# Patient Record
Sex: Female | Born: 1990
Health system: Southern US, Community
[De-identification: ages and names within clinical notes are randomized; demographics above are authoritative.]

## PROBLEM LIST (undated history)

## (undated) DIAGNOSIS — Z973 Presence of spectacles and contact lenses: Secondary | ICD-10-CM

## (undated) DIAGNOSIS — Z1509 Genetic susceptibility to other malignant neoplasm: Secondary | ICD-10-CM

## (undated) DIAGNOSIS — F419 Anxiety disorder, unspecified: Secondary | ICD-10-CM

## (undated) DIAGNOSIS — Z789 Other specified health status: Secondary | ICD-10-CM

## (undated) HISTORY — PX: ABDOMINAL HYSTERECTOMY: SHX81

## (undated) HISTORY — PX: ESOPHAGOGASTRODUODENOSCOPY: SHX1529

## (undated) HISTORY — PX: NO PAST SURGERIES: SHX2092

---
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
## 2009-01-05 IMAGING — US US ABDOMEN COMPLETE
1 series · 14 of 25 positions shown · non-contrast
Comparison: none

CLINICAL DATA: Abdominal pain and vomiting. 
ABDOMEN ULTRASOUND:
TECHNIQUE: Complete abdominal ultrasound examination was performed including evaluation of the liver, gallbladder, bile ducts, pancreas, kidneys, spleen, IVC, and abdominal aorta.

[Series 1: unknown · 0.32mm/px · 14 of 71 slices shown]
[im 1/71]
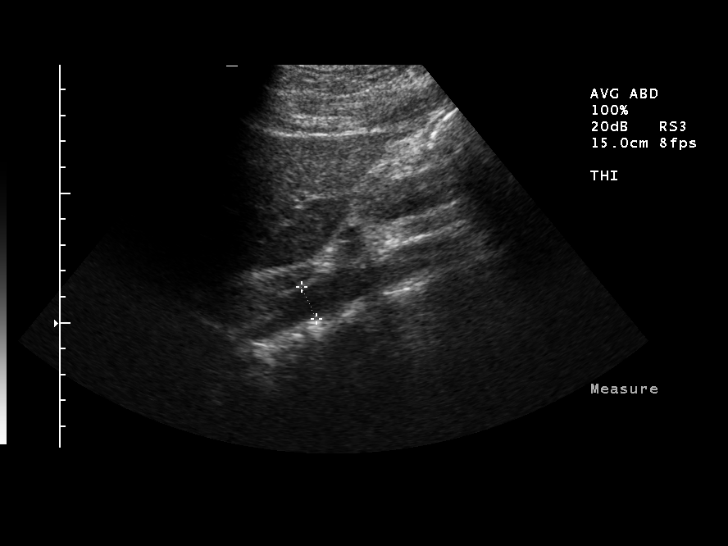
[im 6/71]
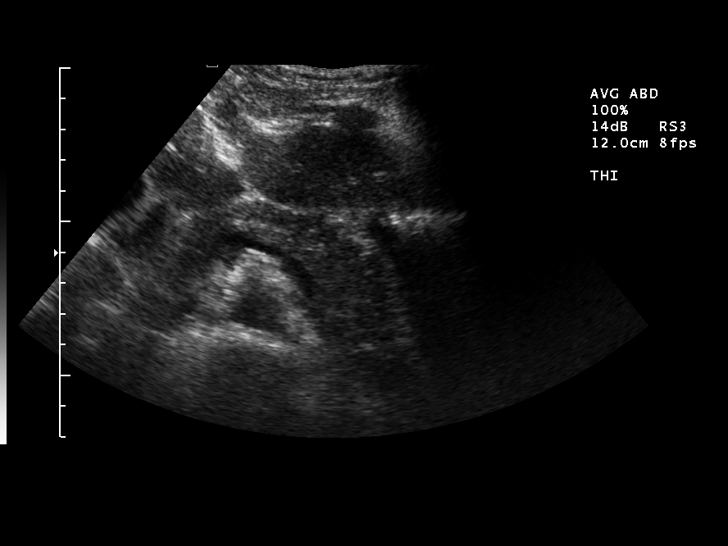
[im 12/71]
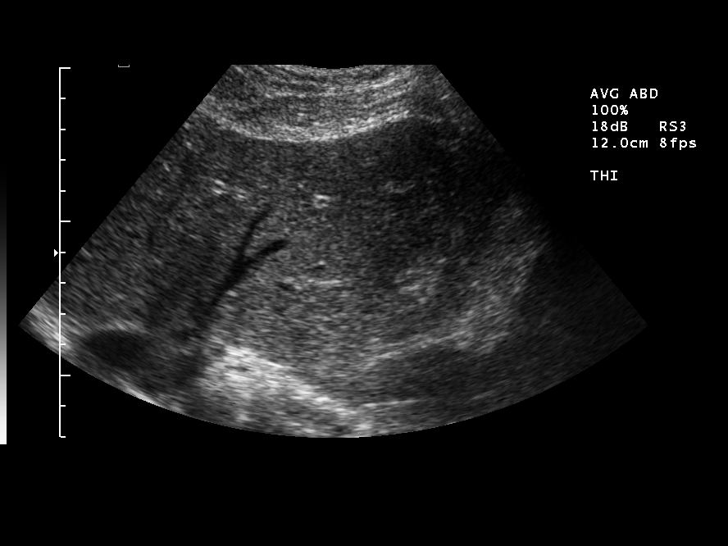
[im 18/71]
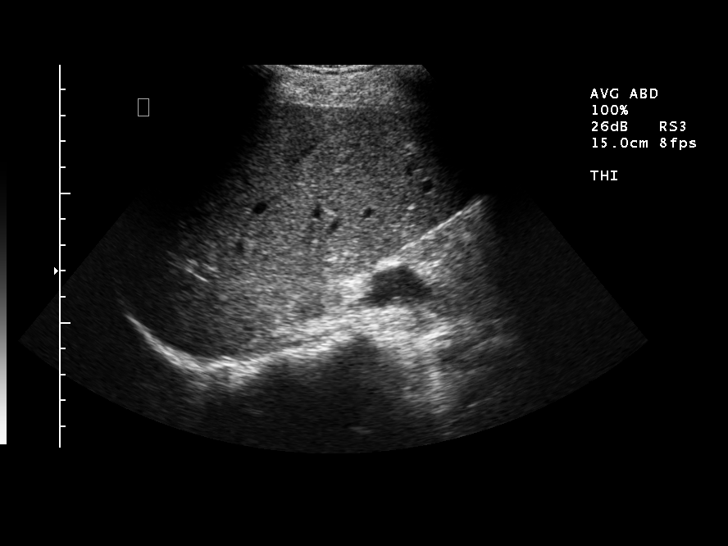
[im 24/71]
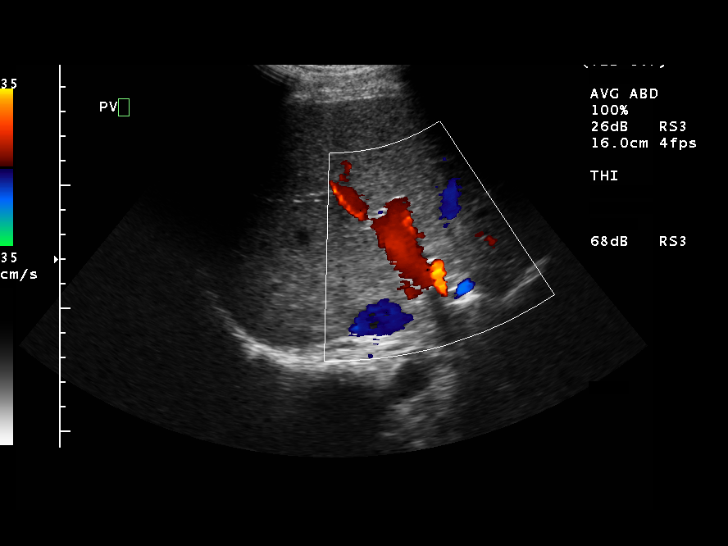
[im 27/71]
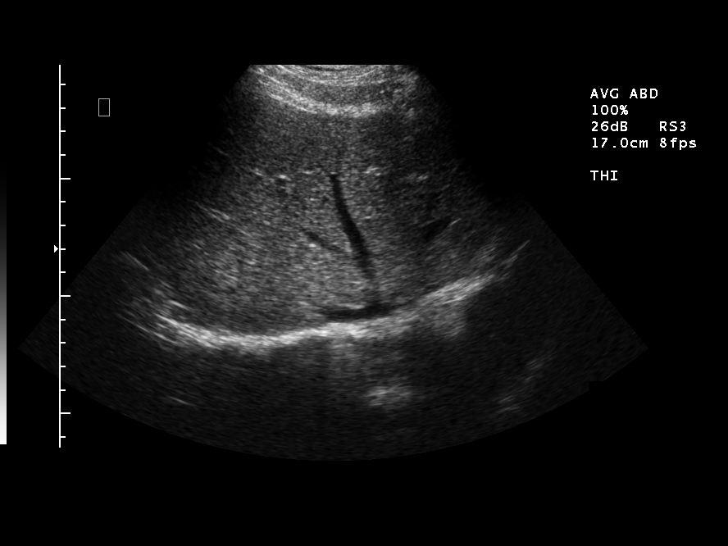
[im 33/71]
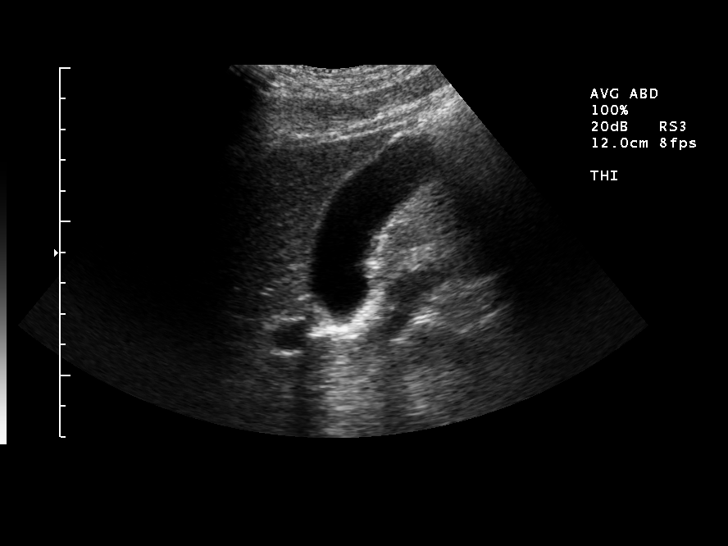
[im 38/71]
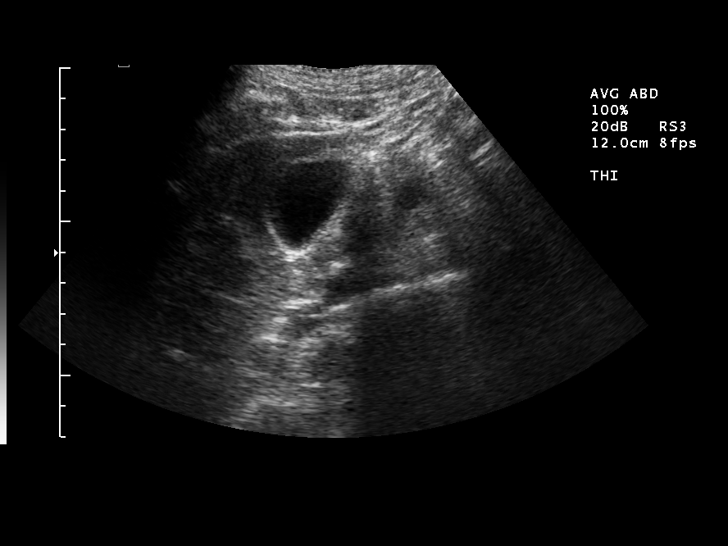
[im 44/71]
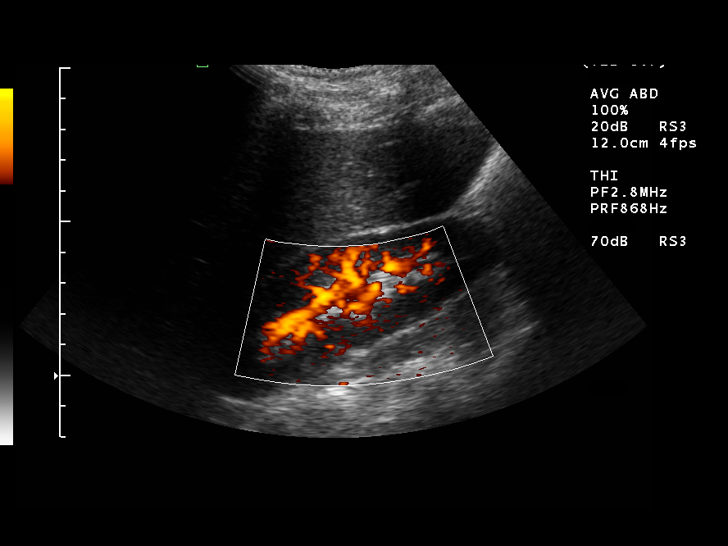
[im 47/71]
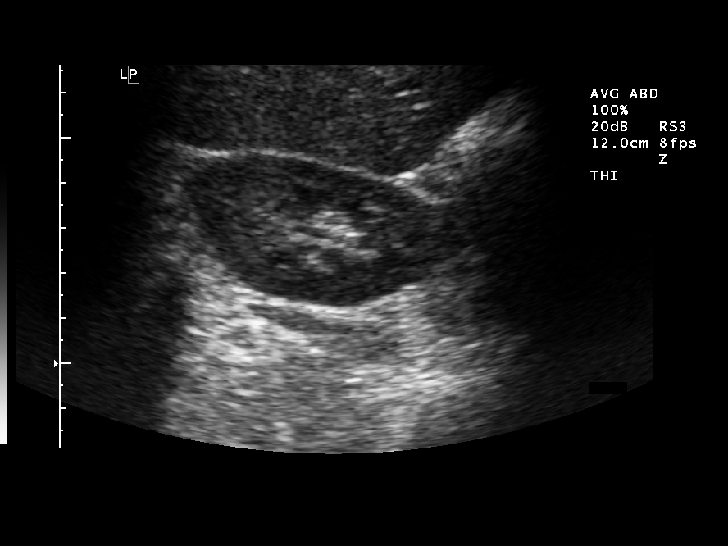
[im 53/71]
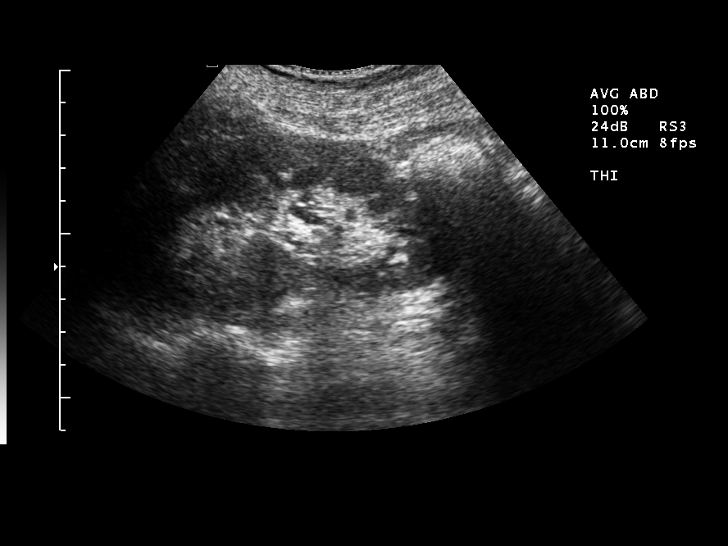
[im 59/71]
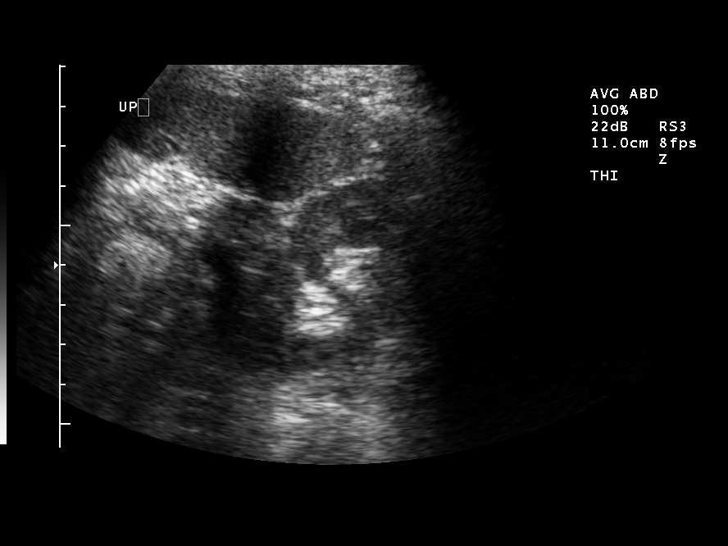
[im 65/71]
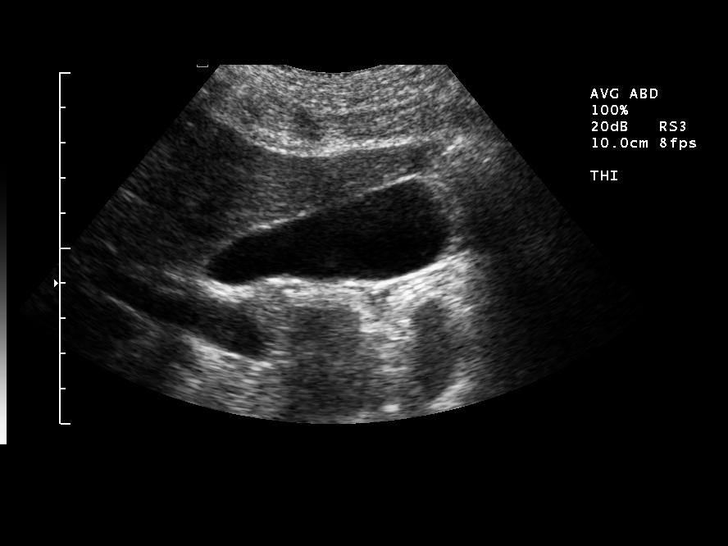
[im 71/71]
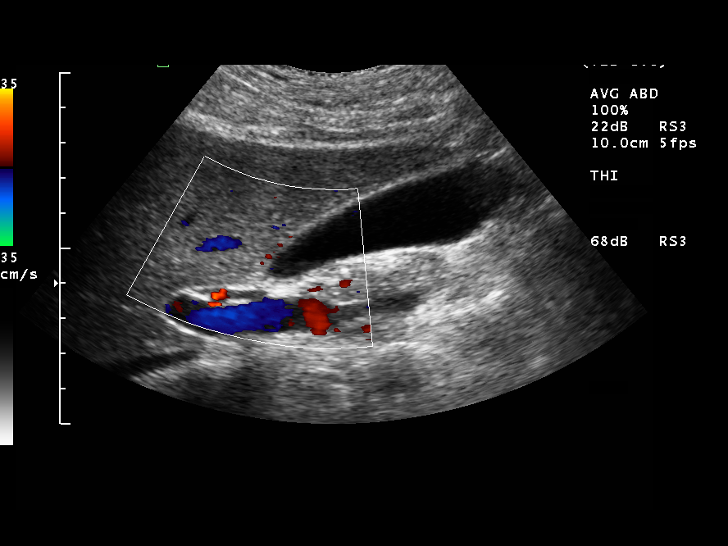

[14 of 25 positions shown; findings below may reference images not displayed]

FINDINGS: Normal gallbladder.  Gallbladder wall thickness is 2 mm.  Common duct measures 3.1 mm in diameter which is well within normal limits.  No hepatic, splenic, pancreas, or renal abnormality.  Spleen measures 9.7 cm in length.  Right and left kidneys measure 11.6 cm and 11.0 cm in length, respectively.  The IVC is patent.  The abdominal aorta maximum diameter is 1.4 cm.  No abdominal mass or abnormal fluid collection.
IMPRESSION: Normal abdominal ultrasound.   Abdominal aorta maximum diameter is 0.8 cm.  Patent IVC.  No abdominal mass or abnormal fluid collection.

## 2009-08-24 ENCOUNTER — Ambulatory Visit: Payer: Self-pay | Admitting: Family Medicine

## 2009-11-19 ENCOUNTER — Emergency Department: Payer: Self-pay | Admitting: Internal Medicine

## 2010-10-16 ENCOUNTER — Telehealth: Payer: Self-pay | Admitting: Internal Medicine

## 2010-12-29 ENCOUNTER — Encounter: Payer: Self-pay | Admitting: *Deleted

## 2011-01-08 NOTE — Procedures (Signed)
Summary: Colonoscopy   Patient Name: Stephanie Brandt, Harl MRN:  Procedure Procedures: Colonoscopy CPT: 34742.  Personnel: Endoscopist: Dora L. [name], MD.  Referred By: Carlean Purl, MD.  Exam Location: Exam performed in Outpatient Clinic. Outpatient  Patient Consent: Procedure, Alternatives, Risks and Benefits discussed, consent obtained, from patient. Consent was obtained by the RN.  Indications Symptoms: Hematochezia.  Increased Risk Screening: For family history of colorectal neoplasia, in  parent age at onset: 18. grandparent age at onset: 15. paternal uncle age 34  History  Current Medications: Patient is not currently taking Coumadin.  Pre-Exam Physical: Performed Jun 05, 2006. Entire physical exam was normal.  Comments: Pt. history reviewed/updated, physical exam performed prior to initiation of sedation?yes Exam Exam: Extent of exam reached: Cecum, extent intended: Cecum.  The cecum was identified by appendiceal orifice and IC valve. Colon retroflexion performed. Images taken. ASA Classification: I. Tolerance: good.  Monitoring: Pulse and BP monitoring, Oximetry used. Supplemental O2 given.  Colon Prep Used Miralax for colon prep. Prep results: good.  Sedation Meds: Patient assessed and found to be appropriate for moderate (conscious) sedation. Fentanyl 100 mcg. given IV. Versed 10 mg. given IV.  Findings - NORMAL EXAM: Cecum.  NORMAL EXAM: Sigmoid Colon.  - NORMAL EXAM: Rectum. Comments: minimal anal fissuring, no hemorrhoids.    Comments: scope withdrawal time 10:36 min Assessment Normal examination.  Comments: no polyps, rectal bleeding likely from an anal source Events  Unplanned Interventions: No intervention was required.  Unplanned Events: There were no complications. Plans Medication Plan: AnusolHC supp: 1 HS, starting Jun 05, 2006   Patient Education: Patient given standard instructions for: Yearly hemoccult testing recommended.  Patient instructed to get routine colonoscopy every 10 years.  Comments: follow up with  me for rectal bleeding in 3 months Disposition: After procedure patient sent to recovery. After recovery patient sent home.    cc:  Carlean Purl, MD  This report was created from the original endoscopy report, which was reviewed and signed by the above listed endoscopist.

## 2011-01-08 NOTE — Progress Notes (Signed)
Summary: Colonoscopy   Phone Note Call from Patient Call back at Home Phone [phone]   Caller: Patient Call For: Dr. [name] Reason for Call: Talk to Nurse Details for Reason: Colonoscopy Summary of Call: Pt. called requesting to be put on sooner "rotation" for colonoscopies (possibly every 4-5 years or sooner). Currently, she is on 10-year, but due to fam hx (father died of colon ca at age 21) and the fact that most of her family tends to develop tumors in their late 20's, she prefers not to wait until she is 53 to have her next procedure. Currently, pt. is having no symptoms. Last colon in 2007 when pt. was 15. Please call to discuss. Initial call taken by: Schuyler Amor,  October 16, 2010 11:29 AM  Follow-up for Phone Call        Message left for patient to call back. Jesse Fall RN  October 16, 2010 11:54 AM Spoke with patient. She is requesting to have a colonoscopy earlier than the 10 year recall recommended after her colonoscopy  from 2023/05/22. Her father died of colon cancer at age 58 and she is concerned about waiting 10 years. She has no symptoms at present. She is aware that it may be next week before I have an answer on this. Please, advise. Follow-up by: Jesse Fall RN,  October 18, 2010 10:26 AM  Additional Follow-up for Phone Call Additional follow up Details #1::        Yes, I agree. Every 5 years  is a appropriate interval. She will be due in June 2012. Please send her a recall letter in May 2012. Additional Follow-up by: Hart Carwin MD,  October 20, 2010 2:39 PM     Appended Document: Colonoscopy    Clinical Lists Changes  Observations: Added new observation of COLONNXTDUE: 06/2011 (10/22/2010 8:20)      Appended Document: Colonoscopy Message left for patient to call back. Jesse Fall, RN 10/22/10 at 8:30 AM  Appended Document: Colonoscopy Spoke with patient. She states her father's brother died of colon cancer recently and they have found out  that he had Lynch syndrome. She is going to [facility] for a consultation and to be tested for the syndrome. She may transfer care there also, she will let us know.  Appended Document: Colonoscopy reviewed and agree.

## 2011-01-08 NOTE — Procedures (Signed)
Summary: EGD   Patient Name: Stephanie Brandt, Bloomquist MRN:  Procedure Procedures: Panendoscopy (EGD) CPT: 43235.    with biopsy(s)/brushing(s). CPT: D1846139.  Personnel: Endoscopist: Dora L. [name], MD.  Exam Location: Exam performed in Outpatient Clinic. Outpatient  Patient Consent: Procedure, Alternatives, Risks and Benefits discussed, consent obtained, from patient. Consent was obtained by the RN.  Indications Symptoms: Vomiting.  History  Current Medications: Patient is not currently taking Coumadin.  Pre-Exam Physical: Performed Feb 24, 2008  Entire physical exam was normal.  Comments: Pt. history reviewed/updated, physical exam performed prior to initiation of sedation?yes Exam Exam Info: Maximum depth of insertion Duodenum, intended Duodenum. Vocal cords visualized. Gastric retroflexion performed. Images taken. ASA Classification: I. Tolerance: good.  Sedation Meds: Patient assessed and found to be appropriate for moderate (conscious) sedation. Fentanyl 50 mcg. given IV. Versed 5 mg. given IV. Cetacaine Spray 2 sprays given aerosolized.  Monitoring: BP and pulse monitoring done. Oximetry used. Supplemental O2 given  Findings - Normal: Distal Esophagus.  - Normal: Fundus to Body. Comments: no retained content.  - DIAGNOSTIC TEST: from Antrum. RUT done, results pending  - DIAGNOSTIC TEST: Biopsy taken. from Duodenal Apex to Duodenal 2nd Portion. Reason: r/o sprue.   Assessment Normal examination.  Comments: normal exam, s/p biopsies for H.Pylori and small bowl biopsies to r/o villous atrophy Events  Unplanned Intervention: No unplanned interventions were required.  Unplanned Events: There were no complications. Plans Comments: I think har symptoms are functional. Would  try low dose Paxil 5 mg Disposition: After procedure patient sent to recovery. After recovery patient sent home.    cc:  Carlean Purl, MD  This report was created from the original  endoscopy report, which was reviewed and signed by the above listed endoscopist.

## 2011-04-23 NOTE — Assessment & Plan Note (Signed)
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 All episodes occurred after a  large meal at supper. This week, she has been out of school because of  nausea, vomiting, and diarrhea and per Dr. Genelle Bal, was put on Zofran. Dr.  Genelle Bal suspected viral gastroenteritis. She feels much better now and  will be returning to school on Monday. We saw her Stephanie Brandt in 2006 for  evaluation of rectal bleeding. She has a strong family history of colon  cancer in her parents, who had colon cancer at the age of 65 and  grandparents at 64. Also paternal uncle had colorectal cancer at age 43.  Her colonoscopy on July 05, 2006 was normal. She was treated with rectal  bleeding with Anusol HC suppositories. There does not seem to be  currently any problem. Stephanie Brandt says that she is intolerant to a lot of  food. She is very restricted to what she is eating. Usually, no  breakfast. She may drink a cup of coffee in the morning. She packs a  peanut butter and jelly sandwich for lunch. For supper, she eats by  herself because she does not want to eat with the rest of the family.  She likes salads but a full meal such as chicken with rice or meats with  vegetables do not agree with her. In spite all of this, her weight has  been stable around 130 pounds. She is under a lot of stress in school,  being a high school student. She plays Lacrosse but otherwise, is active  and has not had to stop any of her activities.   MEDICATIONS:  Birth control pills and Levsin sublingual 0.125 mg p.r.n.   PHYSICAL EXAMINATION:  VITAL SIGNS:  Blood pressure  104/56, pulse 72,  weight 131 pounds.  GENERAL:  Alert and oriented. No distress.  HEENT:  Sclerae nonicteric. Oral cavity was normal.  NECK:  Supple. No adenopathy.  LUNGS:  Clear to auscultation.  CARDIOVASCULAR:  Normal S1 and S2.  ABDOMEN:  Soft, nontender. Normal active bowel sounds. No distention. I  could not elicit any tenderness in the epigastrium or right upper  quadrant. There was no CVA tenderness.  RECTAL:  Exam not done.  EXTREMITIES:  No edema.   IMPRESSION:  A 20 year old high school student with periodic vomiting  without failure to thrive, no weight loss. No objective findings on  physical examination. Rule out  gastroesophageal reflux disease. Rule  out gastroparesis or functional dyspepsia. Rule out biliary disease.  There is a family history of gallbladder problems in her mother with  cholecystectomy.   PLAN:  1. Upper endoscopy. Schedule for diagnostic purposes to rule out any      structural lesions.  2. Begin  Prilosec 20 mg daily. Samples to be given.  3. Upper abdominal ultrasound scheduled.  4. I told her to eat small meals. Avoid large meals, especially eating      out late at night..  5. I would like to followup with her in the office after the upper      endoscopy and upper abdominal ultrasound and will talk about      treatment of her dyspepsia.     Stephanie Brandt. [name], MD  Electronically Signed    DMB/MedQ  DD: 02/19/2008  DT: 02/20/2008  Job #: [id number]

## 2011-04-26 NOTE — Procedures (Signed)
AGE:  20   TECHNICAL DESCRIPTION:  This was a routine EEG.   TECHNICAL DATA:  This EEG was recorded during the awake state.  The  background activity is 14 Hz rhythms with higher amplitudes seen in the  posterior head regions bilaterally.  Hyperventilation testing was performed  which produced no abnormalities, and photic stimulation was performed which  did not produce any evidence of an occipital driving response.  No focal  asymmetries and no definite epileptiform activity was seen in this study.  There was no evidence of any stage 2 sleep present.   IMPRESSION:  This is a normal EEG during the awake state.      EAV:WUJW  D:  10/31/2004 11:29:39  T:  10/31/2004 11:53:40  Job #:  [id number]

## 2011-04-30 ENCOUNTER — Encounter: Payer: Self-pay | Admitting: Internal Medicine

## 2011-06-03 ENCOUNTER — Other Ambulatory Visit: Payer: Self-pay | Admitting: Gynecology

## 2012-03-13 ENCOUNTER — Other Ambulatory Visit: Payer: Self-pay | Admitting: Gynecology

## 2012-07-08 LAB — OB RESULTS CONSOLE ABO/RH

## 2012-07-08 LAB — OB RESULTS CONSOLE GC/CHLAMYDIA
Chlamydia: NEGATIVE
Gonorrhea: NEGATIVE

## 2012-07-08 LAB — OB RESULTS CONSOLE ANTIBODY SCREEN: Antibody Screen: NEGATIVE

## 2012-08-09 HISTORY — PX: WISDOM TOOTH EXTRACTION: SHX21

## 2012-12-09 NOTE — L&D Delivery Note (Signed)
Delivery Note Pt progressed to complete and pushed well.  At 7:47 PM a viable female was delivered via Vaginal, Spontaneous Delivery (Presentation: ; Occiput Anterior).  APGAR: 8, 9; weight pending.   Placenta status: Intact, Spontaneous.  Cord:  with the following complications: None.  Anesthesia: Epidural  Episiotomy: None Lacerations: 2nd degree Suture Repair: 2.0 vicryl and 3-0 Vicryl rapide Est. Blood Loss (mL): 400  Mom to postpartum.  Baby to stay with mom.  Discussed circumcision procedure and risks, I signed consent.    Machaela Caterino D 01/11/2013, 8:22 PM

## 2013-01-06 ENCOUNTER — Inpatient Hospital Stay (HOSPITAL_COMMUNITY)
Admission: AD | Admit: 2013-01-06 | Discharge: 2013-01-06 | Disposition: A | Discharge status: Home / Self Care | Payer: No Typology Code available for payment source | Source: Ambulatory Visit | Attending: Obstetrics and Gynecology | Admitting: Obstetrics and Gynecology

## 2013-01-06 ENCOUNTER — Encounter (HOSPITAL_COMMUNITY): Payer: Self-pay | Admitting: *Deleted

## 2013-01-06 DIAGNOSIS — O479 False labor, unspecified: Secondary | ICD-10-CM | POA: Insufficient documentation

## 2013-01-06 HISTORY — DX: Other specified health status: Z78.9

## 2013-01-06 NOTE — MAU Note (Signed)
Pt states she started having pain about 1230 contractions went away at 106pm. Pt states contractions started up at 1730 and contractions were about 10 min apart. Pt states she took a bath to see if contractions would go away , they didn't so she came in after speaking with Dr Ellyn Hack

## 2013-01-06 NOTE — MAU Note (Signed)
Pt G1 at 38.2wks having contractions since 2030.  Denies leaking fluid.

## 2013-01-06 NOTE — Progress Notes (Signed)
Pt states she was having menstrual like cramping and she took tylenol which took her pain away

## 2013-01-11 ENCOUNTER — Encounter (HOSPITAL_COMMUNITY): Payer: Self-pay | Admitting: Anesthesiology

## 2013-01-11 ENCOUNTER — Encounter (HOSPITAL_COMMUNITY): Payer: Self-pay | Admitting: *Deleted

## 2013-01-11 ENCOUNTER — Inpatient Hospital Stay (HOSPITAL_COMMUNITY): Payer: No Typology Code available for payment source | Admitting: Anesthesiology

## 2013-01-11 ENCOUNTER — Inpatient Hospital Stay (HOSPITAL_COMMUNITY)
Admission: AD | Admit: 2013-01-11 | Discharge: 2013-01-13 | DRG: 775 | Disposition: A | Discharge status: Home / Self Care | Payer: No Typology Code available for payment source | Source: Ambulatory Visit | Attending: Obstetrics and Gynecology | Admitting: Obstetrics and Gynecology

## 2013-01-11 DIAGNOSIS — Z1509 Genetic susceptibility to other malignant neoplasm: Secondary | ICD-10-CM

## 2013-01-11 DIAGNOSIS — O4290 Premature rupture of membranes, unspecified as to length of time between rupture and onset of labor, unspecified weeks of gestation: Secondary | ICD-10-CM | POA: Diagnosis present

## 2013-01-11 DIAGNOSIS — IMO0001 Reserved for inherently not codable concepts without codable children: Secondary | ICD-10-CM

## 2013-01-11 DIAGNOSIS — Z800 Family history of malignant neoplasm of digestive organs: Secondary | ICD-10-CM

## 2013-01-11 HISTORY — DX: Genetic susceptibility to other malignant neoplasm: Z15.09

## 2013-01-11 LAB — CBC
HCT: 33.7 % — ABNORMAL LOW (ref 36.0–46.0)
MCHC: 32 g/dL (ref 30.0–36.0)
RDW: 14.8 % (ref 11.5–15.5)
WBC: 9.6 10*3/uL (ref 4.0–10.5)

## 2013-01-11 LAB — RPR: RPR Ser Ql: NONREACTIVE

## 2013-01-11 LAB — OB RESULTS CONSOLE GBS: GBS: NEGATIVE

## 2013-01-11 MED ORDER — PHENYLEPHRINE 40 MCG/ML (10ML) SYRINGE FOR IV PUSH (FOR BLOOD PRESSURE SUPPORT): 80.0000 ug | PREFILLED_SYRINGE | INTRAVENOUS | Status: DC | PRN

## 2013-01-11 MED ORDER — OXYTOCIN 40 UNITS IN LACTATED RINGERS INFUSION - SIMPLE MED
1.0000 m[IU]/min | INTRAVENOUS | Status: DC
  Administered 2013-01-11: 1 m[IU]/min via INTRAVENOUS
  Filled 2013-01-11: qty 1000

## 2013-01-11 MED ORDER — LIDOCAINE HCL (PF) 1 % IJ SOLN
30.0000 mL | INTRAMUSCULAR | Status: DC | PRN
  Filled 2013-01-11: qty 30

## 2013-01-11 MED ORDER — ONDANSETRON HCL 4 MG PO TABS: 4.0000 mg | ORAL_TABLET | ORAL | Status: DC | PRN

## 2013-01-11 MED ORDER — ONDANSETRON HCL 4 MG/2ML IJ SOLN: 4.0000 mg | INTRAMUSCULAR | Status: DC | PRN

## 2013-01-11 MED ORDER — ZOLPIDEM TARTRATE 5 MG PO TABS: 5.0000 mg | ORAL_TABLET | Freq: Every evening | ORAL | Status: DC | PRN

## 2013-01-11 MED ORDER — FENTANYL 2.5 MCG/ML BUPIVACAINE 1/10 % EPIDURAL INFUSION (WH - ANES)
INTRAMUSCULAR | Status: DC | PRN
  Administered 2013-01-11: 14 mL/h via EPIDURAL

## 2013-01-11 MED ORDER — TETANUS-DIPHTH-ACELL PERTUSSIS 5-2.5-18.5 LF-MCG/0.5 IM SUSP: 0.5000 mL | Freq: Once | INTRAMUSCULAR | Status: DC

## 2013-01-11 MED ORDER — IBUPROFEN 600 MG PO TABS
600.0000 mg | ORAL_TABLET | Freq: Four times a day (QID) | ORAL | Status: DC | PRN
  Administered 2013-01-11: 600 mg via ORAL
  Filled 2013-01-11: qty 1

## 2013-01-11 MED ORDER — LACTATED RINGERS IV SOLN: 500.0000 mL | INTRAVENOUS | Status: DC | PRN

## 2013-01-11 MED ORDER — OXYTOCIN BOLUS FROM INFUSION: 500.0000 mL | INTRAVENOUS | Status: DC

## 2013-01-11 MED ORDER — OXYCODONE-ACETAMINOPHEN 5-325 MG PO TABS
1.0000 | ORAL_TABLET | ORAL | Status: DC | PRN
  Administered 2013-01-12 – 2013-01-13 (×8): 1 via ORAL
  Filled 2013-01-11 (×8): qty 1

## 2013-01-11 MED ORDER — MAGNESIUM HYDROXIDE 400 MG/5ML PO SUSP: 30.0000 mL | ORAL | Status: DC | PRN

## 2013-01-11 MED ORDER — METHYLERGONOVINE MALEATE 0.2 MG PO TABS: 0.2000 mg | ORAL_TABLET | ORAL | Status: DC | PRN

## 2013-01-11 MED ORDER — SIMETHICONE 80 MG PO CHEW: 80.0000 mg | CHEWABLE_TABLET | ORAL | Status: DC | PRN

## 2013-01-11 MED ORDER — OXYCODONE-ACETAMINOPHEN 5-325 MG PO TABS: 1.0000 | ORAL_TABLET | ORAL | Status: DC | PRN

## 2013-01-11 MED ORDER — ACETAMINOPHEN 325 MG PO TABS: 650.0000 mg | ORAL_TABLET | ORAL | Status: DC | PRN

## 2013-01-11 MED ORDER — EPHEDRINE 5 MG/ML INJ: 10.0000 mg | INTRAVENOUS | Status: DC | PRN

## 2013-01-11 MED ORDER — SENNOSIDES-DOCUSATE SODIUM 8.6-50 MG PO TABS
2.0000 | ORAL_TABLET | Freq: Every day | ORAL | Status: DC
  Administered 2013-01-11 – 2013-01-12 (×2): 2 via ORAL

## 2013-01-11 MED ORDER — LANOLIN HYDROUS EX OINT: TOPICAL_OINTMENT | CUTANEOUS | Status: DC | PRN

## 2013-01-11 MED ORDER — LIDOCAINE HCL (PF) 1 % IJ SOLN
INTRAMUSCULAR | Status: DC | PRN
  Administered 2013-01-11 (×2): 4 mL

## 2013-01-11 MED ORDER — OXYTOCIN 40 UNITS IN LACTATED RINGERS INFUSION - SIMPLE MED
62.5000 mL/h | INTRAVENOUS | Status: DC
  Administered 2013-01-11: 62.5 mL/h via INTRAVENOUS

## 2013-01-11 MED ORDER — IBUPROFEN 600 MG PO TABS
600.0000 mg | ORAL_TABLET | Freq: Four times a day (QID) | ORAL | Status: DC
  Administered 2013-01-12 – 2013-01-13 (×6): 600 mg via ORAL
  Filled 2013-01-11 (×6): qty 1

## 2013-01-11 MED ORDER — DIPHENHYDRAMINE HCL 50 MG/ML IJ SOLN: 12.5000 mg | INTRAMUSCULAR | Status: DC | PRN

## 2013-01-11 MED ORDER — LACTATED RINGERS IV SOLN: 500.0000 mL | Freq: Once | INTRAVENOUS | Status: DC

## 2013-01-11 MED ORDER — EPHEDRINE 5 MG/ML INJ
10.0000 mg | INTRAVENOUS | Status: DC | PRN
  Filled 2013-01-11: qty 4

## 2013-01-11 MED ORDER — METHYLERGONOVINE MALEATE 0.2 MG/ML IJ SOLN
0.2000 mg | INTRAMUSCULAR | Status: DC | PRN
Start: 2013-01-11 — End: 2013-01-13

## 2013-01-11 MED ORDER — ONDANSETRON HCL 4 MG/2ML IJ SOLN: 4.0000 mg | Freq: Four times a day (QID) | INTRAMUSCULAR | Status: DC | PRN

## 2013-01-11 MED ORDER — CITRIC ACID-SODIUM CITRATE 334-500 MG/5ML PO SOLN
30.0000 mL | ORAL | Status: DC | PRN
  Administered 2013-01-11: 30 mL via ORAL
  Filled 2013-01-11: qty 15

## 2013-01-11 MED ORDER — PRENATAL MULTIVITAMIN CH
1.0000 | ORAL_TABLET | Freq: Every day | ORAL | Status: DC
  Administered 2013-01-12 – 2013-01-13 (×2): 1 via ORAL
  Filled 2013-01-11 (×2): qty 1

## 2013-01-11 MED ORDER — TERBUTALINE SULFATE 1 MG/ML IJ SOLN: 0.2500 mg | Freq: Once | INTRAMUSCULAR | Status: DC | PRN

## 2013-01-11 MED ORDER — MEASLES, MUMPS & RUBELLA VAC ~~LOC~~ INJ: 0.5000 mL | INJECTION | Freq: Once | SUBCUTANEOUS | Status: DC

## 2013-01-11 MED ORDER — DIBUCAINE 1 % RE OINT: 1.0000 "application " | TOPICAL_OINTMENT | RECTAL | Status: DC | PRN

## 2013-01-11 MED ORDER — LACTATED RINGERS IV SOLN
INTRAVENOUS | Status: DC
  Administered 2013-01-11 (×2): 125 mL/h via INTRAVENOUS

## 2013-01-11 MED ORDER — DIPHENHYDRAMINE HCL 25 MG PO CAPS: 25.0000 mg | ORAL_CAPSULE | Freq: Four times a day (QID) | ORAL | Status: DC | PRN

## 2013-01-11 MED ORDER — FENTANYL 2.5 MCG/ML BUPIVACAINE 1/10 % EPIDURAL INFUSION (WH - ANES)
14.0000 mL/h | INTRAMUSCULAR | Status: DC
  Administered 2013-01-11: 14 mL/h via EPIDURAL
  Filled 2013-01-11 (×2): qty 125

## 2013-01-11 MED ORDER — BENZOCAINE-MENTHOL 20-0.5 % EX AERO
1.0000 "application " | INHALATION_SPRAY | CUTANEOUS | Status: DC | PRN
  Administered 2013-01-11 – 2013-01-12 (×2): 1 via TOPICAL
  Filled 2013-01-11 (×2): qty 56

## 2013-01-11 MED ORDER — PHENYLEPHRINE 40 MCG/ML (10ML) SYRINGE FOR IV PUSH (FOR BLOOD PRESSURE SUPPORT)
80.0000 ug | PREFILLED_SYRINGE | INTRAVENOUS | Status: DC | PRN
  Filled 2013-01-11: qty 5

## 2013-01-11 MED ORDER — WITCH HAZEL-GLYCERIN EX PADS: 1.0000 "application " | MEDICATED_PAD | CUTANEOUS | Status: DC | PRN

## 2013-01-11 NOTE — MAU Note (Signed)
To L/D for eval, with ROM

## 2013-01-11 NOTE — H&P (Signed)
NAMENAREH, MATZKE NO.:  [id number]  MEDICAL RECORD NO.:  [id number]  LOCATION:  9162                          FACILITY:  [facility]  PHYSICIAN:  Malachi Pro. Ambrose Mantle, M.D. DATE OF BIRTH:  25-Jul-1991  DATE OF ADMISSION:  01/11/2013 DATE OF DISCHARGE:                             HISTORY & PHYSICAL   PRESENT ILLNESS:  This is a 22 year old white female, para 0, gravida 1, with [facility] January 18, 2013, by an ultrasound at 9 weeks and 2 days done on June 17, 2012, who is admitted with premature rupture of the membranes.  The rupture of membranes occurred at 4 a.m. on January 11, 2013.  Blood group and type O positive with a negative antibody.  Pap smear not reported rubella immune.  RPR nonreactive.  Urine culture negative.  Hepatitis B surface antigen negative.  HIV negative.  GC and Chlamydia negative.  One hour Glucola 112.  Group B strep negative per patient report, although I do not see it recorded in the prenatal record.  The patient began her prenatal course in our office on July 08, 2012 and gave a history of a Lynch syndrome requiring yearly colonoscopies followed by Dr. Evelene Croon at [facility].  She declined screening tests.  Ultrasound at 18 weeks was normal.  The Lynch syndrome is characterized by an increased risk for ovarian uterine colon cancer. Prenatal course has been uncomplicated.  At 4:00 a.m., she began leaking clear fluid.  She came to the maternity admission unit and was found to have grossly ruptured membranes.  PAST MEDICAL HISTORY:  She does have a history of Lynch syndrome.  SURGICAL HISTORY:  None.  MEDICATIONS:  Only prenatal vitamins.  ALLERGIES:  No known drug allergies.  She apparently is latex sensitive.  FAMILY MEDICAL HISTORY:  Father with colon cancer.  Grandmother with colon cancer, diabetes, heart disease, and high blood pressure.  SOCIAL HISTORY:  Denies alcohol, illicit substance abuse, and tobacco use at the present time.  PHYSICAL  EXAMINATION:  VITAL SIGNS:  On admission, blood pressure is 127/81, pulse is 82, respirations 20, temperature 97.8. HEART:  Normal size and sounds.  No murmurs. LUNGS:  Clear to auscultation. ABDOMEN:  Soft with term size fundus.  Fetal heart tones are normal. Cervix per the nurse's exam is 1 cm 50%, vertex at a -2 station.  The patient is contracting every 2-5 minutes.  They are not significantly painful.  She will be observed to see if Pitocin augmentation is indicated.     Malachi Pro. Ambrose Mantle, M.D.     TFH/MEDQ  D:  01/11/2013  T:  01/11/2013  Job:  [id number]

## 2013-01-11 NOTE — Anesthesia Procedure Notes (Signed)
Epidural Patient location during procedure: OB Start time: 01/11/2013 9:28 AM  Staffing Anesthesiologist: Jaz Mallick A. Performed by: anesthesiologist   Preanesthetic Checklist Completed: patient identified, site marked, surgical consent, pre-op evaluation, timeout performed, IV checked, risks and benefits discussed and monitors and equipment checked  Epidural Patient position: sitting Prep: site prepped and draped and DuraPrep Patient monitoring: continuous pulse ox and blood pressure Approach: midline Injection technique: LOR air  Needle:  Needle type: Tuohy  Needle gauge: 17 G Needle length: 9 cm and 9 Needle insertion depth: 5 cm cm Catheter type: closed end flexible Catheter size: 19 Gauge Catheter at skin depth: 10 cm Test dose: negative and Other  Assessment Events: blood not aspirated, injection not painful, no injection resistance, negative IV test and no paresthesia  Additional Notes Patient identified. Risks and benefits discussed including failed block, incomplete  Pain control, post dural puncture headache, nerve damage, paralysis, blood pressure Changes, nausea, vomiting, reactions to medications-both toxic and allergic and post Partum back pain. All questions were answered. Patient expressed understanding and wished to proceed. Sterile technique was used throughout procedure. Epidural site was Dressed with sterile barrier dressing. No paresthesias, signs of intravascular injection Or signs of intrathecal spread were encountered.  Patient was more comfortable after the epidural was dosed. Please see RN's note for documentation of vital signs and FHR which are stable.

## 2013-01-11 NOTE — MAU Note (Signed)
rom at 0400, no contractions

## 2013-01-11 NOTE — Progress Notes (Signed)
Pt transferred to mbu 110 via wheelchair, fob and family at side. Newborn in mothers arms.

## 2013-01-11 NOTE — Progress Notes (Signed)
Comfortable with epidural Afeb, VSS FHT- Cat I, ctx q 3-5 min VE- 4/90/0, vtx Continue pitocin and monitor progress

## 2013-01-11 NOTE — Anesthesia Preprocedure Evaluation (Signed)

## 2013-01-11 NOTE — Progress Notes (Signed)
Ctx are a bit irregular, will augment with pitocin.   Prenatal records confirm GBS is neg

## 2013-01-12 LAB — ABO/RH: ABO/RH(D): O POS

## 2013-01-12 NOTE — Progress Notes (Signed)
PPD #1 No problems Afeb, VSS Fundus firm, NT at U-0 Continue routine postpartum care 

## 2013-01-12 NOTE — Progress Notes (Signed)
UR chart review completed.  

## 2013-01-12 NOTE — Anesthesia Postprocedure Evaluation (Signed)
   Patient: Stephanie Brandt  Procedure(s) Performed: * No procedures listed *  Anesthesia type: Epidural  Patient location: Mother/Baby  Post pain: Pain level controlled  Post assessment: Post-op Vital signs reviewed  Last Vitals: BP 101/66  Pulse 70  Temp 36.4 C (Oral)  Resp 18  Ht 5\' 5"  (1.651 m)  Wt 191 lb (86.637 kg)  BMI 31.78 kg/m2  SpO2 99%  Breastfeeding? Unknown  Post vital signs: Reviewed  Level of consciousness: awake  Complications: No apparent anesthesia complications

## 2013-01-12 NOTE — Anesthesia Postprocedure Evaluation (Signed)
  Procedure(s) Performed: * No procedures listed *  Anesthesia type: Epidural  Patient location: Mother/Baby  Post pain: Pain level controlled  Post assessment: Post-op Vital signs reviewed  Last Vitals: BP 101/66  Pulse 70  Temp 36.4 C (Oral)  Resp 18  Ht 5\' 5"  (1.651 m)  Wt 191 lb (86.637 kg)  BMI 31.78 kg/m2  SpO2 99%  Breastfeeding? Unknown  Post vital signs: Reviewed  Level of consciousness: awake  Complications: No apparent anesthesia complications

## 2013-01-13 MED ORDER — OXYCODONE-ACETAMINOPHEN 5-325 MG PO TABS
1.0000 | ORAL_TABLET | ORAL | Status: DC | PRN
Start: 2013-01-13 — End: 2017-04-21

## 2013-01-13 MED ORDER — BISACODYL 10 MG RE SUPP: 10.0000 mg | Freq: Once | RECTAL | Status: DC

## 2013-01-13 MED ORDER — BISACODYL 10 MG RE SUPP
10.0000 mg | Freq: Once | RECTAL | Status: AC
  Administered 2013-01-13: 10 mg via RECTAL
  Filled 2013-01-13: qty 1

## 2013-01-13 MED ORDER — IBUPROFEN 600 MG PO TABS: 600.0000 mg | ORAL_TABLET | Freq: Four times a day (QID) | ORAL | Status: DC

## 2013-01-13 NOTE — Progress Notes (Signed)
PPD #2 No problems Afeb, VSS Fundus firm, NT D/c home 

## 2013-01-13 NOTE — Discharge Summary (Signed)
Obstetric Discharge Summary Reason for Admission: rupture of membranes Prenatal Procedures: none Intrapartum Procedures: spontaneous vaginal delivery Postpartum Procedures: none Complications-Operative and Postpartum: 2nd degree perineal laceration Hemoglobin  Date Value Range Status  01/11/2013 10.8* 12.0 - 15.0 g/dL Final     HCT  Date Value Range Status  01/11/2013 33.7* 36.0 - 46.0 % Final    Physical Exam:  General: alert Lochia: appropriate Uterine Fundus: firm  Discharge Diagnoses: Term Pregnancy-delivered  Discharge Information: Date: 01/13/2013 Activity: pelvic rest Diet: routine Medications: Ibuprofen and Percocet Condition: stable Instructions: refer to practice specific booklet Discharge to: home Follow-up Information    Follow up with Clayton Bosserman D, MD. Schedule an appointment as soon as possible for a visit in 6 weeks.   Contact information:   [address] [place] [address] [phone]          Newborn Data: Live born female  Birth Weight: 7 lb 9.2 oz (3435 g) APGAR: 8, 9  Home with mother.  Arnecia Ector D 01/13/2013, 10:11 AM

## 2014-03-29 ENCOUNTER — Emergency Department: Payer: Self-pay | Admitting: Emergency Medicine

## 2014-03-29 LAB — COMPREHENSIVE METABOLIC PANEL
ALK PHOS: 90 U/L
ANION GAP: 5 — AB (ref 7–16)
AST: 23 U/L (ref 15–37)
Albumin: 4 g/dL (ref 3.4–5.0)
BILIRUBIN TOTAL: 0.8 mg/dL (ref 0.2–1.0)
BUN: 16 mg/dL (ref 7–18)
CO2: 26 mmol/L (ref 21–32)
CREATININE: 0.72 mg/dL (ref 0.60–1.30)
Calcium, Total: 8.8 mg/dL (ref 8.5–10.1)
Chloride: 107 mmol/L (ref 98–107)
Glucose: 108 mg/dL — ABNORMAL HIGH (ref 65–99)
OSMOLALITY: 277 (ref 275–301)
Potassium: 3.7 mmol/L (ref 3.5–5.1)
SGPT (ALT): 19 U/L (ref 12–78)
Sodium: 138 mmol/L (ref 136–145)
TOTAL PROTEIN: 8 g/dL (ref 6.4–8.2)

## 2014-03-29 LAB — URINALYSIS, COMPLETE
BILIRUBIN, UR: NEGATIVE
Bacteria: NONE SEEN
Blood: NEGATIVE
GLUCOSE, UR: NEGATIVE mg/dL (ref 0–75)
Leukocyte Esterase: NEGATIVE
NITRITE: NEGATIVE
PROTEIN: NEGATIVE
Ph: 5 (ref 4.5–8.0)
RBC,UR: 1 /HPF (ref 0–5)
SPECIFIC GRAVITY: 1.027 (ref 1.003–1.030)
Squamous Epithelial: 5
WBC UR: 4 /HPF (ref 0–5)

## 2014-03-29 LAB — CBC WITH DIFFERENTIAL/PLATELET
BASOS PCT: 0.2 %
Basophil #: 0 10*3/uL (ref 0.0–0.1)
Eosinophil #: 0.1 10*3/uL (ref 0.0–0.7)
Eosinophil %: 0.8 %
HCT: 44.2 % (ref 35.0–47.0)
HGB: 14.3 g/dL (ref 12.0–16.0)
Lymphocyte #: 0.6 10*3/uL — ABNORMAL LOW (ref 1.0–3.6)
Lymphocyte %: 5 %
MCH: 25.8 pg — ABNORMAL LOW (ref 26.0–34.0)
MCHC: 32.2 g/dL (ref 32.0–36.0)
MCV: 80 fL (ref 80–100)
MONO ABS: 0.7 x10 3/mm (ref 0.2–0.9)
Monocyte %: 5.4 %
Neutrophil #: 11.2 10*3/uL — ABNORMAL HIGH (ref 1.4–6.5)
Neutrophil %: 88.6 %
PLATELETS: 218 10*3/uL (ref 150–440)
RBC: 5.52 10*6/uL — AB (ref 3.80–5.20)
RDW: 15.6 % — ABNORMAL HIGH (ref 11.5–14.5)
WBC: 12.7 10*3/uL — ABNORMAL HIGH (ref 3.6–11.0)

## 2014-03-29 LAB — LIPASE, BLOOD: LIPASE: 102 U/L (ref 73–393)

## 2014-07-14 ENCOUNTER — Encounter: Payer: Self-pay | Admitting: Internal Medicine

## 2014-10-10 ENCOUNTER — Encounter (HOSPITAL_COMMUNITY): Payer: Self-pay | Admitting: *Deleted

## 2015-10-05 LAB — HM PAP SMEAR: HM PAP: NEGATIVE

## 2016-04-24 ENCOUNTER — Encounter: Payer: Self-pay | Admitting: Gastroenterology

## 2016-10-11 DIAGNOSIS — Z800 Family history of malignant neoplasm of digestive organs: Secondary | ICD-10-CM | POA: Insufficient documentation

## 2016-12-30 DIAGNOSIS — L700 Acne vulgaris: Secondary | ICD-10-CM | POA: Diagnosis not present

## 2016-12-30 DIAGNOSIS — D2262 Melanocytic nevi of left upper limb, including shoulder: Secondary | ICD-10-CM | POA: Diagnosis not present

## 2016-12-30 DIAGNOSIS — D1801 Hemangioma of skin and subcutaneous tissue: Secondary | ICD-10-CM | POA: Diagnosis not present

## 2017-03-28 DIAGNOSIS — Z1211 Encounter for screening for malignant neoplasm of colon: Secondary | ICD-10-CM | POA: Diagnosis not present

## 2017-04-04 ENCOUNTER — Emergency Department
Admission: EM | Admit: 2017-04-04 | Discharge: 2017-04-04 | Disposition: A | Discharge status: Home / Self Care | Payer: 59 | Attending: Emergency Medicine | Admitting: Emergency Medicine

## 2017-04-04 DIAGNOSIS — R202 Paresthesia of skin: Secondary | ICD-10-CM

## 2017-04-04 DIAGNOSIS — F430 Acute stress reaction: Secondary | ICD-10-CM

## 2017-04-04 DIAGNOSIS — Z79899 Other long term (current) drug therapy: Secondary | ICD-10-CM | POA: Diagnosis not present

## 2017-04-04 DIAGNOSIS — F410 Panic disorder [episodic paroxysmal anxiety] without agoraphobia: Secondary | ICD-10-CM | POA: Diagnosis not present

## 2017-04-04 DIAGNOSIS — F17200 Nicotine dependence, unspecified, uncomplicated: Secondary | ICD-10-CM | POA: Diagnosis not present

## 2017-04-04 HISTORY — DX: Anxiety disorder, unspecified: F41.9

## 2017-04-04 LAB — CBC WITH DIFFERENTIAL/PLATELET
Basophils Absolute: 0.1 10*3/uL (ref 0–0.1)
Basophils Relative: 1 %
Eosinophils Absolute: 0.1 10*3/uL (ref 0–0.7)
Eosinophils Relative: 2 %
HCT: 40.1 % (ref 35.0–47.0)
HEMOGLOBIN: 13.8 g/dL (ref 12.0–16.0)
LYMPHS ABS: 2.3 10*3/uL (ref 1.0–3.6)
LYMPHS PCT: 27 %
MCH: 28.4 pg (ref 26.0–34.0)
MCHC: 34.4 g/dL (ref 32.0–36.0)
MCV: 82.6 fL (ref 80.0–100.0)
MONO ABS: 0.7 10*3/uL (ref 0.2–0.9)
Monocytes Relative: 8 %
NEUTROS PCT: 62 %
Neutro Abs: 5.3 10*3/uL (ref 1.4–6.5)
Platelets: 263 10*3/uL (ref 150–440)
RBC: 4.86 MIL/uL (ref 3.80–5.20)
RDW: 13.1 % (ref 11.5–14.5)
WBC: 8.4 10*3/uL (ref 3.6–11.0)

## 2017-04-04 LAB — BASIC METABOLIC PANEL
Anion gap: 6 (ref 5–15)
BUN: 10 mg/dL (ref 6–20)
CALCIUM: 8.8 mg/dL — AB (ref 8.9–10.3)
CHLORIDE: 104 mmol/L (ref 101–111)
CO2: 26 mmol/L (ref 22–32)
CREATININE: 0.62 mg/dL (ref 0.44–1.00)
GFR calc non Af Amer: >60 mL/min (ref >60)
GLUCOSE: 102 mg/dL — AB (ref 65–99)
Potassium: 3.3 mmol/L — ABNORMAL LOW (ref 3.5–5.1)
Sodium: 136 mmol/L (ref 135–145)

## 2017-04-04 MED ORDER — CLONAZEPAM 1 MG PO TABS: 1.0000 mg | ORAL_TABLET | Freq: Three times a day (TID) | ORAL | 0 refills | Status: DC | PRN

## 2017-04-04 NOTE — ED Provider Notes (Signed)
[facility] Emergency Department Provider Note       Time seen: ----------------------------------------- 9:03 PM on 04/04/2017 -----------------------------------------     I have reviewed the triage vital signs and the nursing notes.   HISTORY   Chief Complaint Numbness    HPI Stephanie Brandt is a 26 y.o. female who presents to the ED for pain in her right arm and numbness and tingling in her right fingers. Patient states numbness and tingling progressed to her face. Patient states the pain and tingling in the right arm have improved but she still having numbness and tingling in both sides of her face. Patient denies fevers, chills or other complaints. Patient has had a previous history of this with panic attacks.   Past Medical History:  Diagnosis Date  . Anxiety   . Lynch syndrome    Risk for ovarian, uterine, & colon cancer. Yearly colonscopies with Dr. Rogers Blocker at [facility]  . No pertinent past medical history     Patient Active Problem List   Diagnosis Date Noted  . Active labor at term 01/11/2013  . SVD (spontaneous vaginal delivery) 01/11/2013    Past Surgical History:  Procedure Laterality Date  . NO PAST SURGERIES    . [place] EXTRACTION  September 2013    Allergies Latex  Social History Social History  Substance Use Topics  . Smoking status: Current Every Day Smoker  . Smokeless tobacco: Never Used  . Alcohol use No    Review of Systems Constitutional: Negative for fever. Eyes: Negative for vision changes ENT:  Negative for congestion, sore throat Cardiovascular: Negative for chest pain. Respiratory: Negative for shortness of breath. Gastrointestinal: Negative for abdominal pain, vomiting and diarrhea. Genitourinary: Negative for dysuria. Musculoskeletal: Negative for back pain. Skin: Negative for rash. Neurological: Positive for numbness, tingling  All systems negative/normal/unremarkable except as stated in the  HPI  ____________________________________________   PHYSICAL EXAM:  VITAL SIGNS: ED Triage Vitals  Enc Vitals Group     BP 04/04/17 1729 126/83     Pulse Rate 04/04/17 1729 73     Resp 04/04/17 1729 16     Temp 04/04/17 1729 98.4 F (36.9 C)     Temp Source 04/04/17 1729 Oral     SpO2 04/04/17 1729 100 %     Weight 04/04/17 1731 155 lb (70.3 kg)     Height --      Head Circumference --      Peak Flow --      Pain Score --      Pain Loc --      Pain Edu? --      Excl. in [place]? --     Constitutional: Alert and oriented. Well appearing and in no distress. Eyes: Conjunctivae are normal. PERRL. Normal extraocular movements. ENT   Head: Normocephalic and atraumatic.   Nose: No congestion/rhinnorhea.   Mouth/Throat: Mucous membranes are moist.   Neck: No stridor. Cardiovascular: Normal rate, regular rhythm. No murmurs, rubs, or gallops. Respiratory: Normal respiratory effort without tachypnea nor retractions. Breath sounds are clear and equal bilaterally. No wheezes/rales/rhonchi. Gastrointestinal: Soft and nontender. Normal bowel sounds Musculoskeletal: Nontender with normal range of motion in extremities. No lower extremity tenderness nor edema. Neurologic:  Normal speech and language. No gross focal neurologic deficits are appreciated.  Skin:  Skin is warm, dry and intact. No rash noted. Psychiatric: Mood and affect are normal. Speech and behavior are normal.  ____________________________________________  EKG: Interpreted by me. Sinus rhythm rate  78 bpm, normal PR, normal QRS, normal QT.  ____________________________________________  ED COURSE:  Pertinent labs & imaging results that were available during my care of the patient were reviewed by me and considered in my medical decision making (see chart for details). Patient presents for paresthesias, we will assess with labs as indicated.   Procedures ____________________________________________   LABS  (pertinent positives/negatives)  Labs Reviewed  BASIC METABOLIC PANEL - Abnormal; Notable for the following:       Result Value   Potassium 3.3 (*)    Glucose, Bld 102 (*)    Calcium 8.8 (*)    All other components within normal limits  CBC WITH DIFFERENTIAL/PLATELET  POC URINE PREG, ED   ____________________________________________  FINAL ASSESSMENT AND PLAN  Panic attack  Plan: Patient's labs were dictated above. Patient had presented for numbness and tingling that is apparently from a panic attack. Clinically all of her symptoms have resolved and she has had similar instances in the past. I will prescribe Klonopin and she can take as needed. She is stable for outpatient follow-up with her doctor.   Earleen Newport, MD   Note: This note was generated in part or whole with voice recognition software. Voice recognition is usually quite accurate but there are transcription errors that can and very often do occur. I apologize for any typographical errors that were not detected and corrected.     Earleen Newport, MD 04/04/17 2104

## 2017-04-04 NOTE — ED Triage Notes (Signed)
Pt to ED from [facility] walk-in. Pt states that today around 1630 she started having pain in her right arm and numbness and tingling in the right fingers. Pt states that numbness and tingling progressed to her face. Pt states that pain and numbness and tingling in her arm and hand have improved but she is still having the numbness and tingling in both sides of her face.

## 2017-04-04 NOTE — ED Notes (Signed)
Pt discharged to home.  Family member driving.  Discharge instructions reviewed.  Verbalized understanding.  No questions or concerns at this time.  Teach back verified.  Pt in NAD.  No items left in ED.   

## 2017-04-04 NOTE — ED Notes (Signed)
Pt states that all previous symptoms have resolved and that she no longer has any numbness or chest pain at this time.

## 2017-04-21 ENCOUNTER — Encounter: Payer: Self-pay | Admitting: Internal Medicine

## 2017-04-21 ENCOUNTER — Ambulatory Visit (INDEPENDENT_AMBULATORY_CARE_PROVIDER_SITE_OTHER): Payer: 59 | Admitting: Internal Medicine

## 2017-04-21 VITALS — BP 110/70 | HR 77 | Temp 97.6°F | Ht 65.5 in | Wt 155.5 lb

## 2017-04-21 DIAGNOSIS — Z1509 Genetic susceptibility to other malignant neoplasm: Secondary | ICD-10-CM

## 2017-04-21 DIAGNOSIS — F411 Generalized anxiety disorder: Secondary | ICD-10-CM | POA: Insufficient documentation

## 2017-04-21 DIAGNOSIS — Q8789 Other specified congenital malformation syndromes, not elsewhere classified: Secondary | ICD-10-CM | POA: Diagnosis not present

## 2017-04-21 MED ORDER — FLUOXETINE HCL 10 MG PO TABS: 10.0000 mg | ORAL_TABLET | Freq: Every day | ORAL | 2 refills | Status: DC

## 2017-04-21 NOTE — Progress Notes (Signed)
HPI  Pt presents to the clinic today to establish care and for management of the conditions listed below. She has not had a PCP in many years.  Anxiety: Chronic. Triggered by general life stress, death of her father at age 26, ex husbands arrest for child abuse. She takes Clonazepam a few times per month. She has not taken anything daily for anxiety but reports she would really like to discuss this. She denies depression, SI/HI.  Lynch Syndrome: She reports she gets yearly colonoscopies. She is being followed by Dr. Rogers Blocker.  [place] Syndrome: She reports she has mild stomach issues associated with this. She does not take anything for this. She does not follow with any specialist.  Flu: never Tetanus: 12/2006 Pap Smear: 2017 Dentist: biannually  Past Medical History:  Diagnosis Date  . Anxiety   . Lynch syndrome    Risk for ovarian, uterine, & colon cancer. Yearly colonscopies with Dr[facility] Rogers Blocker at [facility]  . No pertinent past medical history     Current Outpatient Prescriptions  Medication Sig Dispense Refill  . clonazePAM (KLONOPIN) 1 MG tablet Take 1 tablet (1 mg total) by mouth 3 (three) times daily as needed for anxiety. 30 tablet 0  . ibuprofen (ADVIL,MOTRIN) 600 MG tablet Take 1 tablet (600 mg total) by mouth every 6 (six) hours. 30 tablet 0   No current facility-administered medications for this visit.     Allergies  Allergen Reactions  . Latex Hives    Family History  Problem Relation Age of Onset  . Cancer Father   . Hypertension Mother     Social History   Social History  . Marital status: Divorced    Spouse name: N/A  . Number of children: N/A  . Years of education: N/A   Occupational History  . Not on file.   Social History Main Topics  . Smoking status: Current Every Day Smoker  . Smokeless tobacco: Never Used  . Alcohol use Yes  . Drug use: No  . Sexual activity: Yes    Birth control/ protection: None, Pill   Other Topics Concern  . Not on file    Social History Narrative  . No narrative on file    ROS:  Constitutional: Denies fever, malaise, fatigue, headache or abrupt weight changes.  HEENT: Denies eye pain, eye redness, ear pain, ringing in the ears, wax buildup, runny nose, nasal congestion, bloody nose, or sore throat. Respiratory: Denies difficulty breathing, shortness of breath, cough or sputum production.   Cardiovascular: Denies chest pain, chest tightness, palpitations or swelling in the hands or feet.  Gastrointestinal: Denies abdominal pain, bloating, constipation, diarrhea or blood in the stool.  GU: Denies frequency, urgency, pain with urination, blood in urine, odor or discharge. Musculoskeletal: Denies decrease in range of motion, difficulty with gait, muscle pain or joint pain and swelling.  Skin: Denies redness, rashes, lesions or ulcercations.  Neurological: Denies dizziness, difficulty with memory, difficulty with speech or problems with balance and coordination.  Psych: Pt reports anxiety. Denies depression, SI/HI.  No other specific complaints in a complete review of systems (except as listed in HPI above).  PE:  BP 110/70   Pulse 77   Temp 97.6 F (36.4 C) (Oral)   Ht 5' 5.5" (1.664 m)   Wt 155 lb 8 oz (70.5 kg)   LMP 04/21/2017   SpO2 98%   BMI 25.48 kg/m  Wt Readings from Last 3 Encounters:  04/21/17 155 lb 8 oz (70.5 kg)  04/04/17  155 lb (70.3 kg)  01/11/13 191 lb (86.6 kg)    General: Appears her stated age, in NAD. Skin: Dry and intact. Cardiovascular: Normal rate and rhythm. S1,S2 noted.  No murmur, rubs or gallops noted.  Pulmonary/Chest: Normal effort and positive vesicular breath sounds. No respiratory distress. No wheezes, rales or ronchi noted.  Neurological: Alert and oriented.  Psychiatric: She is anxious appearing. Behavior is normal. Judgment and thought content normal.    BMET    Component Value Date/Time   NA 136 04/04/2017 1741   NA 138 03/29/2014 0638   K 3.3 (L)  04/04/2017 1741   K 3.7 03/29/2014 0638   CL 104 04/04/2017 1741   CL 107 03/29/2014 0638   CO2 26 04/04/2017 1741   CO2 26 03/29/2014 0638   GLUCOSE 102 (H) 04/04/2017 1741   GLUCOSE 108 (H) 03/29/2014 0638   BUN 10 04/04/2017 1741   BUN 16 03/29/2014 0638   CREATININE 0.62 04/04/2017 1741   CREATININE 0.72 03/29/2014 0638   CALCIUM 8.8 (L) 04/04/2017 1741   CALCIUM 8.8 03/29/2014 0638   GFRNONAA >60 04/04/2017 1741   GFRNONAA >60 03/29/2014 0638   GFRAA >60 04/04/2017 1741   GFRAA >60 03/29/2014 0638    Lipid Panel  No results found for: CHOL, TRIG, HDL, CHOLHDL, VLDL, LDLCALC  CBC    Component Value Date/Time   WBC 8.4 04/04/2017 1741   RBC 4.86 04/04/2017 1741   HGB 13.8 04/04/2017 1741   HGB 14.3 03/29/2014 0638   HCT 40.1 04/04/2017 1741   HCT 44.2 03/29/2014 0638   PLT 263 04/04/2017 1741   PLT 218 03/29/2014 0638   MCV 82.6 04/04/2017 1741   MCV 80 03/29/2014 0638   MCH 28.4 04/04/2017 1741   MCHC 34.4 04/04/2017 1741   RDW 13.1 04/04/2017 1741   RDW 15.6 (H) 03/29/2014 0638   LYMPHSABS 2.3 04/04/2017 1741   LYMPHSABS 0.6 (L) 03/29/2014 0638   MONOABS 0.7 04/04/2017 1741   MONOABS 0.7 03/29/2014 0638   EOSABS 0.1 04/04/2017 1741   EOSABS 0.1 03/29/2014 0638   BASOSABS 0.1 04/04/2017 1741   BASOSABS 0.0 03/29/2014 0638    Hgb A1C No results found for: HGBA1C   Assessment and Plan:   RTC in 1 month for your annual exam Webb Silversmith, NP

## 2017-04-21 NOTE — Assessment & Plan Note (Signed)
Discussed treatment options eRx for Prozac 10 mg daily Support offered today Continue Clonazepam prn

## 2017-04-21 NOTE — Assessment & Plan Note (Signed)
Currently no issues Will monitor 

## 2017-04-21 NOTE — Progress Notes (Signed)
Pre visit review using our clinic review tool, if applicable. No additional management support is needed unless otherwise documented below in the visit note. 

## 2017-04-21 NOTE — Assessment & Plan Note (Signed)
She will continue to be followed by [facility] for this

## 2017-04-21 NOTE — Patient Instructions (Signed)
Generalized Anxiety Disorder, Adult Generalized anxiety disorder (GAD) is a mental health disorder. People with this condition constantly worry about everyday events. Unlike normal anxiety, worry related to GAD is not triggered by a specific event. These worries also do not fade or get better with time. GAD interferes with life functions, including relationships, work, and school. GAD can vary from mild to severe. People with severe GAD can have intense waves of anxiety with physical symptoms (panic attacks). What are the causes? The exact cause of GAD is not known. What increases the risk? This condition is more likely to develop in:  Women.  People who have a family history of anxiety disorders.  People who are very shy.  People who experience very stressful life events, such as the death of a loved one.  People who have a very stressful family environment. What are the signs or symptoms? People with GAD often worry excessively about many things in their lives, such as their health and family. They may also be overly concerned about:  Doing well at work.  Being on time.  Natural disasters.  Friendships. Physical symptoms of GAD include:  Fatigue.  Muscle tension or having muscle twitches.  Trembling or feeling shaky.  Being easily startled.  Feeling like your heart is pounding or racing.  Feeling out of breath or like you cannot take a deep breath.  Having trouble falling asleep or staying asleep.  Sweating.  Nausea, diarrhea, or irritable bowel syndrome (IBS).  Headaches.  Trouble concentrating or remembering facts.  Restlessness.  Irritability. How is this diagnosed? Your health care provider can diagnose GAD based on your symptoms and medical history. You will also have a physical exam. The health care provider will ask specific questions about your symptoms, including how severe they are, when they started, and if they come and go. Your health care  provider may ask you about your use of alcohol or drugs, including prescription medicines. Your health care provider may refer you to a mental health specialist for further evaluation. Your health care provider will do a thorough examination and may perform additional tests to rule out other possible causes of your symptoms. To be diagnosed with GAD, a person must have anxiety that:  Is out of his or her control.  Affects several different aspects of his or her life, such as work and relationships.  Causes distress that makes him or her unable to take part in normal activities.  Includes at least three physical symptoms of GAD, such as restlessness, fatigue, trouble concentrating, irritability, muscle tension, or sleep problems. Before your health care provider can confirm a diagnosis of GAD, these symptoms must be present more days than they are not, and they must last for six months or longer. How is this treated? The following therapies are usually used to treat GAD:  Medicine. Antidepressant medicine is usually prescribed for long-term daily control. Antianxiety medicines may be added in severe cases, especially when panic attacks occur.  Talk therapy (psychotherapy). Certain types of talk therapy can be helpful in treating GAD by providing support, education, and guidance. Options include:  Cognitive behavioral therapy (CBT). People learn coping skills and techniques to ease their anxiety. They learn to identify unrealistic or negative thoughts and behaviors and to replace them with positive ones.  Acceptance and commitment therapy (ACT). This treatment teaches people how to be mindful as a way to cope with unwanted thoughts and feelings.  Biofeedback. This process trains you to manage your body's response (  physiological response) through breathing techniques and relaxation methods. You will work with a therapist while machines are used to monitor your physical symptoms.  Stress  management techniques. These include yoga, meditation, and exercise. A mental health specialist can help determine which treatment is best for you. Some people see improvement with one type of therapy. However, other people require a combination of therapies. Follow these instructions at home:  Take over-the-counter and prescription medicines only as told by your health care provider.  Try to maintain a normal routine.  Try to anticipate stressful situations and allow extra time to manage them.  Practice any stress management or self-calming techniques as taught by your health care provider.  Do not punish yourself for setbacks or for not making progress.  Try to recognize your accomplishments, even if they are small.  Keep all follow-up visits as told by your health care provider. This is important. Contact a health care provider if:  Your symptoms do not get better.  Your symptoms get worse.  You have signs of depression, such as:  A persistently sad, cranky, or irritable mood.  Loss of enjoyment in activities that used to bring you joy.  Change in weight or eating.  Changes in sleeping habits.  Avoiding friends or family members.  Loss of energy for normal tasks.  Feelings of guilt or worthlessness. Get help right away if:  You have serious thoughts about hurting yourself or others. If you ever feel like you may hurt yourself or others, or have thoughts about taking your own life, get help right away. You can go to your nearest emergency department or call:  Your local emergency services (911 in the [place]  A suicide crisis helpline, such as the National Suicide Prevention Lifeline at 1-800-273-8255. This is open 24 hours a day. Summary  Generalized anxiety disorder (GAD) is a mental health disorder that involves worry that is not triggered by a specific event.  People with GAD often worry excessively about many things in their lives, such as their health and  family.  GAD may cause physical symptoms such as restlessness, trouble concentrating, sleep problems, frequent sweating, nausea, diarrhea, headaches, and trembling or muscle twitching.  A mental health specialist can help determine which treatment is best for you. Some people see improvement with one type of therapy. However, other people require a combination of therapies. This information is not intended to replace advice given to you by your health care provider. Make sure you discuss any questions you have with your health care provider. Document Released: 03/22/2013 Document Revised: 10/15/2016 Document Reviewed: 10/15/2016 Elsevier Interactive Patient Education  2017 [employer]  

## 2017-05-23 ENCOUNTER — Telehealth: Payer: Self-pay

## 2017-05-23 NOTE — Telephone Encounter (Signed)
Patient calls to request an antibiotic for strep throat as she is sure that she has it.  Reports 2 pus pockets in back of throat and we had no openings to see her today.   Patient is planning a trip out of town tomorrow and would like treatment called in to pharmacy.  Patient informed that we cannot treat her for a possible strep throat without an evaluation.  She verbalizes understanding and agrees to go to a walk-in clinic this evening for treatment.

## 2017-05-27 NOTE — Telephone Encounter (Signed)
Agree with advice given

## 2017-06-02 ENCOUNTER — Ambulatory Visit (INDEPENDENT_AMBULATORY_CARE_PROVIDER_SITE_OTHER): Payer: 59 | Admitting: Internal Medicine

## 2017-06-02 ENCOUNTER — Encounter: Payer: Self-pay | Admitting: Internal Medicine

## 2017-06-02 VITALS — BP 108/70 | HR 61 | Temp 97.9°F | Ht 66.0 in | Wt 153.0 lb

## 2017-06-02 DIAGNOSIS — Z0000 Encounter for general adult medical examination without abnormal findings: Secondary | ICD-10-CM

## 2017-06-02 NOTE — Progress Notes (Signed)
Subjective:    Patient ID: Stephanie Brandt, female    DOB: [birth year], 26 y.o.   MRN: [id number]  HPI  Pt presents to the clinic today for her annual exam.  Flu: never Tetanus: 12/2006 Pap Smear: 2017,[facility] Dentist: biannually  Diet: She does eat meat. She consumes more veggies than fruit. She tries to avoid fried foods. She drinks mostly Ginger Ale and water. Exercise: She walks 30 minutes daily.  Review of Systems      Past Medical History:  Diagnosis Date  . Anxiety   . Lynch syndrome    Risk for ovarian, uterine, & colon cancer. Yearly colonscopies with Dr. Rogers Blocker at [facility]  . No pertinent past medical history     Current Outpatient Prescriptions  Medication Sig Dispense Refill  . clonazePAM (KLONOPIN) 1 MG tablet Take 1 tablet (1 mg total) by mouth 3 (three) times daily as needed for anxiety. 30 tablet 0  . FLUoxetine (PROZAC) 10 MG tablet Take 1 tablet (10 mg total) by mouth daily. 30 tablet 2  . ibuprofen (ADVIL,MOTRIN) 600 MG tablet Take 1 tablet (600 mg total) by mouth every 6 (six) hours. 30 tablet 0   No current facility-administered medications for this visit.     Allergies  Allergen Reactions  . Latex Hives    Family History  Problem Relation Age of Onset  . Cancer Father   . Hypertension Mother     Social History   Social History  . Marital status: Divorced    Spouse name: N/A  . Number of children: N/A  . Years of education: N/A   Occupational History  . Not on file.   Social History Main Topics  . Smoking status: Current Every Day Smoker  . Smokeless tobacco: Never Used  . Alcohol use Yes  . Drug use: No  . Sexual activity: Yes    Birth control/ protection: None, Pill   Other Topics Concern  . Not on file   Social History Narrative  . No narrative on file     Constitutional: Denies fever, malaise, fatigue, headache or abrupt weight changes.  HEENT: Denies eye pain, eye redness, ear pain, ringing in the ears, wax buildup,  runny nose, nasal congestion, bloody nose, or sore throat. Respiratory: Denies difficulty breathing, shortness of breath, cough or sputum production.   Cardiovascular: Denies chest pain, chest tightness, palpitations or swelling in the hands or feet.  Gastrointestinal: Pt reports intermittent reflux. Denies abdominal pain, bloating, constipation, diarrhea or blood in the stool.  GU: Denies urgency, frequency, pain with urination, burning sensation, blood in urine, odor or discharge. Musculoskeletal: Denies decrease in range of motion, difficulty with gait, muscle pain or joint pain and swelling.  Skin: Denies redness, rashes, lesions or ulcercations.  Neurological: Denies dizziness, difficulty with memory, difficulty with speech or problems with balance and coordination.  Psych: Pt reports anxiety. Denies depression, SI/HI.  No other specific complaints in a complete review of systems (except as listed in HPI above).  Objective:   Physical Exam   BP 108/70   Pulse 61   Temp 97.9 F (36.6 C) (Oral)   Ht 5\' 6"  (1.676 m)   Wt 153 lb (69.4 kg)   LMP 05/25/2017   SpO2 98%   Breastfeeding? Yes   BMI 24.69 kg/m  Wt Readings from Last 3 Encounters:  06/02/17 153 lb (69.4 kg)  04/21/17 155 lb 8 oz (70.5 kg)  04/04/17 155 lb (70.3 kg)    General: Appears  her stated age, well developed, well nourished in NAD. Skin: Warm, dry and intact.  HEENT: Head: normal shape and size; Eyes: sclera white, no icterus, conjunctiva pink, PERRLA and EOMs intact; Ears: Tm's gray and intact, normal light reflex; Throat/Mouth: Teeth present, mucosa pink and moist, no exudate, lesions or ulcerations noted.  Neck:  Neck supple, trachea midline. No masses, lumps or thyromegaly present.  Cardiovascular: Normal rate and rhythm. S1,S2 noted.  No murmur, rubs or gallops noted. No JVD or BLE edema.  Pulmonary/Chest: Normal effort and positive vesicular breath sounds. No respiratory distress. No wheezes, rales or  ronchi noted.  Abdomen: Soft and nontender. Normal bowel sounds. No distention or masses noted. Liver, spleen and kidneys non palpable. Musculoskeletal: Strength 5/5 BUE/BLE. No difficulty with gait.  Neurological: Alert and oriented. Cranial nerves II-XII grossly intact. Coordination normal.  Psychiatric: Mood and affect normal. Behavior is normal. Judgment and thought content normal.   BMET    Component Value Date/Time   NA 136 04/04/2017 1741   NA 138 03/29/2014 0638   K 3.3 (L) 04/04/2017 1741   K 3.7 03/29/2014 0638   CL 104 04/04/2017 1741   CL 107 03/29/2014 0638   CO2 26 04/04/2017 1741   CO2 26 03/29/2014 0638   GLUCOSE 102 (H) 04/04/2017 1741   GLUCOSE 108 (H) 03/29/2014 0638   BUN 10 04/04/2017 1741   BUN 16 03/29/2014 0638   CREATININE 0.62 04/04/2017 1741   CREATININE 0.72 03/29/2014 0638   CALCIUM 8.8 (L) 04/04/2017 1741   CALCIUM 8.8 03/29/2014 0638   GFRNONAA >60 04/04/2017 1741   GFRNONAA >60 03/29/2014 0638   GFRAA >60 04/04/2017 1741   GFRAA >60 03/29/2014 0638    Lipid Panel  No results found for: CHOL, TRIG, HDL, CHOLHDL, VLDL, LDLCALC  CBC    Component Value Date/Time   WBC 8.4 04/04/2017 1741   RBC 4.86 04/04/2017 1741   HGB 13.8 04/04/2017 1741   HGB 14.3 03/29/2014 0638   HCT 40.1 04/04/2017 1741   HCT 44.2 03/29/2014 0638   PLT 263 04/04/2017 1741   PLT 218 03/29/2014 0638   MCV 82.6 04/04/2017 1741   MCV 80 03/29/2014 0638   MCH 28.4 04/04/2017 1741   MCHC 34.4 04/04/2017 1741   RDW 13.1 04/04/2017 1741   RDW 15.6 (H) 03/29/2014 0638   LYMPHSABS 2.3 04/04/2017 1741   LYMPHSABS 0.6 (L) 03/29/2014 0638   MONOABS 0.7 04/04/2017 1741   MONOABS 0.7 03/29/2014 0638   EOSABS 0.1 04/04/2017 1741   EOSABS 0.1 03/29/2014 0638   BASOSABS 0.1 04/04/2017 1741   BASOSABS 0.0 03/29/2014 0638    Hgb A1C No results found for: HGBA1C         Assessment & Plan:   Preventative Health Maintenance:  Encouraged her to get a flu shot in the  fall She will bring me a copy of her tetanus record Pap smear UTD, will request records Encouraged her to consume a balanced diet and exercise regimen Advised her to see an eye doctor and dentist annually Will check CBC, CMET, Lipid and TSH   RTC in 1 year, sooner if needed Webb Silversmith, NP

## 2017-06-02 NOTE — Patient Instructions (Signed)

## 2017-06-02 NOTE — Addendum Note (Signed)
Addended by: Marchia Bond on: 06/02/2017 03:14 PM   Modules accepted: Orders

## 2017-06-03 LAB — COMPREHENSIVE METABOLIC PANEL
ALBUMIN: 4.2 g/dL (ref 3.5–5.5)
ALT: 14 IU/L (ref 0–32)
AST: 18 IU/L (ref 0–40)
Albumin/Globulin Ratio: 1.3 (ref 1.2–2.2)
Alkaline Phosphatase: 61 IU/L (ref 39–117)
BUN / CREAT RATIO: 19 (ref 9–23)
BUN: 11 mg/dL (ref 6–20)
Bilirubin Total: 0.3 mg/dL (ref 0.0–1.2)
CALCIUM: 9 mg/dL (ref 8.7–10.2)
CO2: 21 mmol/L (ref 20–29)
CREATININE: 0.57 mg/dL (ref 0.57–1.00)
Chloride: 100 mmol/L (ref 96–106)
GFR, EST AFRICAN AMERICAN: 147 mL/min/{1.73_m2} (ref >59)
GFR, EST NON AFRICAN AMERICAN: 127 mL/min/{1.73_m2} (ref >59)
GLOBULIN, TOTAL: 3.2 g/dL (ref 1.5–4.5)
GLUCOSE: 56 mg/dL — AB (ref 65–99)
Potassium: 4.4 mmol/L (ref 3.5–5.2)
SODIUM: 138 mmol/L (ref 134–144)
TOTAL PROTEIN: 7.4 g/dL (ref 6.0–8.5)

## 2017-06-03 LAB — CBC
Hematocrit: 41.2 % (ref 34.0–46.6)
Hemoglobin: 13.6 g/dL (ref 11.1–15.9)
MCH: 27.6 pg (ref 26.6–33.0)
MCHC: 33 g/dL (ref 31.5–35.7)
MCV: 84 fL (ref 79–97)
PLATELETS: 308 10*3/uL (ref 150–379)
RBC: 4.92 x10E6/uL (ref 3.77–5.28)
RDW: 13.3 % (ref 12.3–15.4)
WBC: 8.2 10*3/uL (ref 3.4–10.8)

## 2017-06-03 LAB — LIPID PANEL
CHOL/HDL RATIO: 3.5 ratio (ref 0.0–4.4)
Cholesterol, Total: 113 mg/dL (ref 100–199)
HDL: 32 mg/dL — ABNORMAL LOW (ref >39)
LDL CALC: 57 mg/dL (ref 0–99)
Triglycerides: 118 mg/dL (ref 0–149)
VLDL Cholesterol Cal: 24 mg/dL (ref 5–40)

## 2017-06-03 LAB — TSH: TSH: 2.17 u[IU]/mL (ref 0.450–4.500)

## 2017-06-24 ENCOUNTER — Encounter: Payer: Self-pay | Admitting: Internal Medicine

## 2017-08-13 ENCOUNTER — Encounter: Payer: Self-pay | Admitting: Internal Medicine

## 2017-08-13 ENCOUNTER — Ambulatory Visit (INDEPENDENT_AMBULATORY_CARE_PROVIDER_SITE_OTHER): Payer: 59 | Admitting: Internal Medicine

## 2017-08-13 ENCOUNTER — Ambulatory Visit (INDEPENDENT_AMBULATORY_CARE_PROVIDER_SITE_OTHER)
Admission: RE | Admit: 2017-08-13 | Discharge: 2017-08-13 | Disposition: A | Discharge status: Home / Self Care | Payer: 59 | Source: Ambulatory Visit | Attending: Internal Medicine | Admitting: Internal Medicine

## 2017-08-13 VITALS — BP 116/72 | HR 74 | Temp 97.9°F | Wt 154.5 lb

## 2017-08-13 DIAGNOSIS — M79672 Pain in left foot: Secondary | ICD-10-CM

## 2017-08-13 NOTE — Progress Notes (Signed)
Subjective:    Patient ID: Stephanie Brandt, female    DOB: 1991-08-19, 26 y.o.   MRN: [id number]  HPI  Pt presents to the clinic today with c/o left foot pain. She reports this started yesterday. She reports is started as a cramp, and then radiated to the bottom of her foot. She reports that she was unable to bear weight last night. She has not noticed any swelling or bruising. She denies any injury to the area. She is still having pain with walking and weight bearing today. She has tried Ice and Ibuprofen with minimal relief.   Review of Systems      Past Medical History:  Diagnosis Date  . Anxiety   . Lynch syndrome    Risk for ovarian, uterine, & colon cancer. Yearly colonscopies with Dr. Rogers Blocker at [facility]  . No pertinent past medical history     Current Outpatient Prescriptions  Medication Sig Dispense Refill  . clonazePAM (KLONOPIN) 1 MG tablet Take 1 tablet (1 mg total) by mouth 3 (three) times daily as needed for anxiety. 30 tablet 0  . FLUoxetine (PROZAC) 10 MG tablet Take 1 tablet (10 mg total) by mouth daily. (Patient not taking: Reported on 06/02/2017) 30 tablet 2   No current facility-administered medications for this visit.     Allergies  Allergen Reactions  . Latex Hives  . Hydrocodone Hives    Family History  Problem Relation Age of Onset  . Cancer Father   . Hypertension Mother     Social History   Social History  . Marital status: Divorced    Spouse name: N/A  . Number of children: N/A  . Years of education: N/A   Occupational History  . Not on file.   Social History Main Topics  . Smoking status: Current Every Day Smoker  . Smokeless tobacco: Never Used  . Alcohol use Yes  . Drug use: No  . Sexual activity: Yes    Birth control/ protection: None, Pill   Other Topics Concern  . Not on file   Social History Narrative  . No narrative on file     Constitutional: Denies fever, malaise, fatigue, headache or abrupt weight changes.    Musculoskeletal: Pt reports foot pain and difficulty with gait. Denies decrease in range of motion, muscle pain or joint swelling.    No other specific complaints in a complete review of systems (except as listed in HPI above).  Objective:   Physical Exam  BP 116/72   Pulse 74   Temp 97.9 F (36.6 C) (Oral)   Wt 154 lb 8 oz (70.1 kg)   LMP 08/12/2017   SpO2 97%   BMI 24.94 kg/m  Wt Readings from Last 3 Encounters:  08/13/17 154 lb 8 oz (70.1 kg)  06/02/17 153 lb (69.4 kg)  04/21/17 155 lb 8 oz (70.5 kg)    General: Appears her stated age, well developed, well nourished in NAD. Musculoskeletal: Normal flexion and extension of the left foot. Pain with rotation. Pain with palpation of the lateral portion of the bottom of the foot and over the 5th metatarsal. No swelling noted. She limps with normal gait. She is able to walk on heels and toes.  BMET    Component Value Date/Time   NA 138 06/02/2017 1514   NA 138 03/29/2014 0638   K 4.4 06/02/2017 1514   K 3.7 03/29/2014 0638   CL 100 06/02/2017 1514   CL 107 03/29/2014 1638  CO2 21 06/02/2017 1514   CO2 26 03/29/2014 0638   GLUCOSE 56 (L) 06/02/2017 1514   GLUCOSE 102 (H) 04/04/2017 1741   GLUCOSE 108 (H) 03/29/2014 0638   BUN 11 06/02/2017 1514   BUN 16 03/29/2014 0638   CREATININE 0.57 06/02/2017 1514   CREATININE 0.72 03/29/2014 0638   CALCIUM 9.0 06/02/2017 1514   CALCIUM 8.8 03/29/2014 0638   GFRNONAA 127 06/02/2017 1514   GFRNONAA >60 03/29/2014 0638   GFRAA 147 06/02/2017 1514   GFRAA >60 03/29/2014 0638    Lipid Panel     Component Value Date/Time   CHOL 113 06/02/2017 1514   TRIG 118 06/02/2017 1514   HDL 32 (L) 06/02/2017 1514   CHOLHDL 3.5 06/02/2017 1514   LDLCALC 57 06/02/2017 1514    CBC    Component Value Date/Time   WBC 8.2 06/02/2017 1514   WBC 8.4 04/04/2017 1741   RBC 4.92 06/02/2017 1514   RBC 4.86 04/04/2017 1741   HGB 13.6 06/02/2017 1514   HCT 41.2 06/02/2017 1514   PLT 308  06/02/2017 1514   MCV 84 06/02/2017 1514   MCV 80 03/29/2014 0638   MCH 27.6 06/02/2017 1514   MCH 28.4 04/04/2017 1741   MCHC 33.0 06/02/2017 1514   MCHC 34.4 04/04/2017 1741   RDW 13.3 06/02/2017 1514   RDW 15.6 (H) 03/29/2014 0638   LYMPHSABS 2.3 04/04/2017 1741   LYMPHSABS 0.6 (L) 03/29/2014 0638   MONOABS 0.7 04/04/2017 1741   MONOABS 0.7 03/29/2014 0638   EOSABS 0.1 04/04/2017 1741   EOSABS 0.1 03/29/2014 0638   BASOSABS 0.1 04/04/2017 1741   BASOSABS 0.0 03/29/2014 0638    Hgb A1C No results found for: HGBA1C          Assessment & Plan:   Left Foot Pain:  Xray of left foot today Patient placed in post op shoe Continue Ibuprofen and Ice  Will follow up after xray is back, return precautions discussed Webb Silversmith, NP

## 2017-08-13 NOTE — Patient Instructions (Signed)
Metatarsal Fracture Rehab Ask your health care provider or physical therapist when you can start doing exercises at home. Doing these exercises 3-5 times a week can help you regain strength and flexibility. If doing any of these exercises causes pain, stop and contact your health care provider or physical therapist. Exercises Heel cord stretch  Do 2 sets of 10 repetitions. 1. Stand facing a wall with your healthy foot forward and your knee slightly bent. 2. Place both hands on the wall for support. 3. Stretch your healing foot out straight behind you. 4. Keep both heels on the floor. 5. Hold the stretch for 30 seconds. You can also do this exercise with both knees slightly bent. Repeat the same steps. Golf ball roll Do this once every day.  Sit on a chair and roll a golf ball under your healing foot for two minutes. Towel stretch  1. Sit on the floor with your legs out straight. 2. Loop a towel around the front of your healing foot. 3. Use both hands to pull the ends of the towel, stretching your foot back toward your body. 4. Hold the stretch for 30 seconds and repeat 3 times. Calf raises Do 2 sets of 10 repetitions. 1. Stand behind a chair and use your hands to support you. 2. Lift your good foot off the ground. 3. Rise up on the front of your healing foot as high as you can. 4. Repeat the lift 10 times. Marble pickup Do this once a day. 1. Sit in a chair and put 20 marbles on the floor in front of you. 2. Use the toes of your healing foot to pick up each marble. Towel curls  Repeat this exercise 5 times. 1. Sit in a chair and place a small towel on the floor in front of you. 2. Use the toes of your healing foot to grab the towel and pull it toward you. This information is not intended to replace advice given to you by your health care provider. Make sure you discuss any questions you have with your health care provider. Document Released: 11/25/2005 Document Revised:  08/01/2016 Document Reviewed: 03/10/2014 Elsevier Interactive Patient Education  [employer].

## 2017-09-16 ENCOUNTER — Other Ambulatory Visit: Payer: Self-pay | Admitting: Internal Medicine

## 2017-10-13 ENCOUNTER — Other Ambulatory Visit: Payer: Self-pay | Admitting: Internal Medicine

## 2017-10-13 NOTE — Telephone Encounter (Signed)
Last refill 09/16/17  Last OV 08/13/17  Ok to refill?

## 2017-11-05 DIAGNOSIS — N3090 Cystitis, unspecified without hematuria: Secondary | ICD-10-CM | POA: Diagnosis not present

## 2017-11-19 DIAGNOSIS — J020 Streptococcal pharyngitis: Secondary | ICD-10-CM | POA: Diagnosis not present

## 2017-11-20 ENCOUNTER — Ambulatory Visit: Payer: Self-pay

## 2017-11-20 NOTE — Telephone Encounter (Signed)
  Reason for Disposition . SEVERE (e.g., excruciating) throat pain  Answer Assessment - Initial Assessment Questions 1. ONSET: "When did the throat start hurting?" (Hours or days ago)      Started yesterday 2. SEVERITY: "How bad is the sore throat?" (Scale 1-10; mild, moderate or severe)   - MILD (1-3):  doesn't interfere with eating or normal activities   - MODERATE (4-7): interferes with eating some solids and normal activities   - SEVERE (8-10):  excruciating pain, interferes with most normal activities   - SEVERE DYSPHAGIA: can't swallow liquids, drooling     Moderate 3. STREP EXPOSURE: "Has there been any exposure to strep within the past week?" If so, ask: "What type of contact occurred?"      No 4.  VIRAL SYMPTOMS: "Are there any symptoms of a cold, such as a runny nose, cough, hoarse voice or red eyes?"      Headache, achy neck and shoulders 5. FEVER: "Do you have a fever?" If so, ask: "What is your temperature, how was it measured, and when did it start?"     No 6. PUS ON THE TONSILS: "Is there pus on the tonsils in the back of your throat?"     Tonsils swollen - no pus 7. OTHER SYMPTOMS: "Do you have any other symptoms?" (e.g., difficulty breathing, headache, rash)     Headache 8. PREGNANCY: "Is there any chance you are pregnant?" "When was your last menstrual period?"     No  Protocols used: SORE THROAT-A-AH Pt. States started Amoxil yesterday for strep throat and "doesn't feel a lot better." Request appointment, which was made. Instructed to continue antibiotic and home remedies for comfort.

## 2017-11-21 ENCOUNTER — Ambulatory Visit: Payer: 59 | Admitting: Family Medicine

## 2018-04-02 ENCOUNTER — Other Ambulatory Visit: Payer: Self-pay | Admitting: Internal Medicine

## 2018-04-02 MED ORDER — FLUOXETINE HCL 10 MG PO CAPS: ORAL_CAPSULE | ORAL | 0 refills | Status: DC

## 2018-04-02 NOTE — Telephone Encounter (Signed)
Copied from [place] [id number]. Topic: Quick Communication - See Telephone Encounter >> Apr 02, 2018  8:56 AM Ahmed Prima L wrote: CRM for notification. See Telephone encounter for: 04/02/18.  Patient said that her insurance is requiring her to have a 90 day supply for the FLUoxetine (PROZAC) 10 MG capsule instead of 30 days. She said they told her to ask her primary care doctor and see if they would send that to [facility] on [address] instead of [facility]. Please advise.

## 2018-04-02 NOTE — Telephone Encounter (Signed)
Lm on pts vm and advised 90D supply sent to [employer]. Pt has upcoming 06/2018 CPE

## 2018-06-16 ENCOUNTER — Encounter: Payer: 59 | Admitting: Internal Medicine

## 2018-06-25 IMAGING — DX DG FOOT COMPLETE 3+V*L*
3 series · 3 of 3 positions shown · non-contrast
Comparison: Report of a left ankle series of September 10, 2002

CLINICAL DATA: Left foot pain.  No report of injury.

EXAM:
LEFT FOOT - COMPLETE 3+ VIEW

[foot ap]
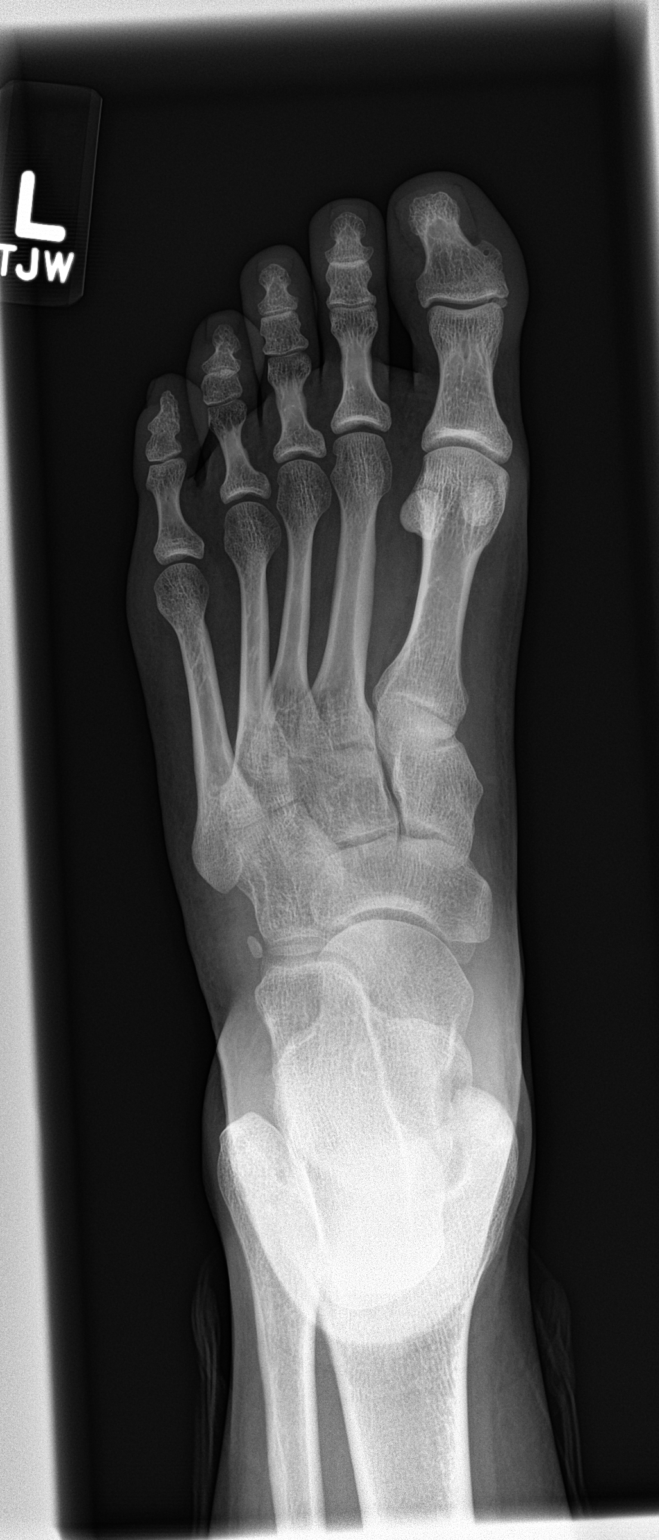

[foot obl]
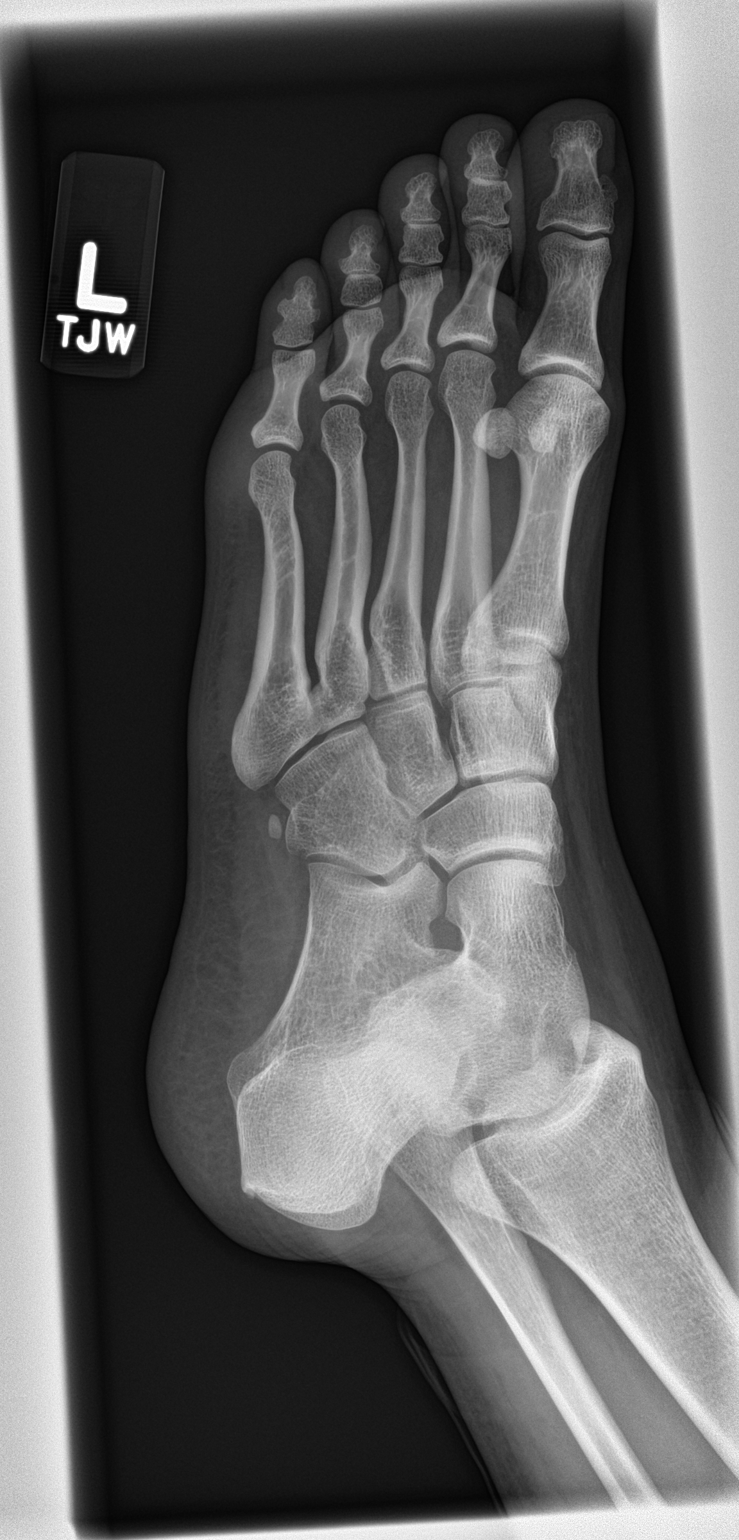

[foot lat]
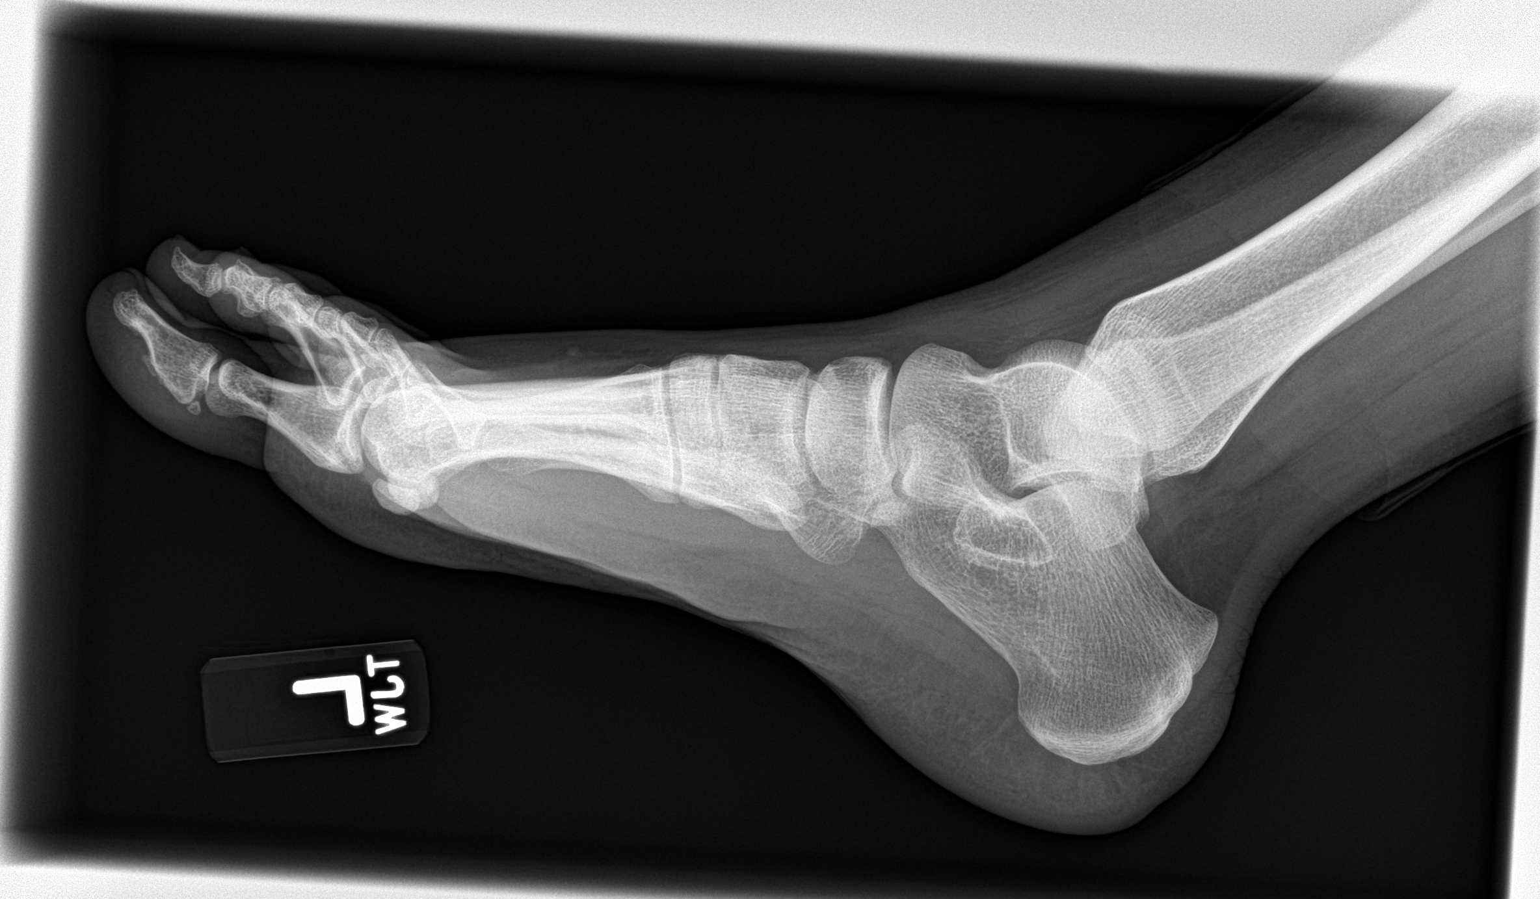

[3 of 3 positions shown; findings below may reference images not displayed]

FINDINGS: The bones are subjectively adequately mineralized. There is no acute
fracture nor dislocation. There is no periosteal reaction or lytic
or blastic lesion. The joint spaces are reasonably well-maintained.
The soft tissues are unremarkable.
IMPRESSION: There is no acute bony abnormality of the left foot. If the
patient's symptoms persist and remain unexplained, MRI would be a
useful next imaging step.

## 2018-08-04 ENCOUNTER — Encounter: Payer: Self-pay | Admitting: Internal Medicine

## 2018-08-13 ENCOUNTER — Ambulatory Visit: Payer: Managed Care, Other (non HMO) | Admitting: Internal Medicine

## 2018-08-13 DIAGNOSIS — Z0289 Encounter for other administrative examinations: Secondary | ICD-10-CM

## 2018-08-13 NOTE — Progress Notes (Deleted)
   Subjective:    Patient ID: Stephanie Brandt, female    DOB: 23-Sep-1991, 27 y.o.   MRN: [id number]  HPI  Pt presents to the clinic today for form completion. She is unable to meet the requirements  Review of Systems     Objective:   Physical Exam        Assessment & Plan:

## 2018-09-22 ENCOUNTER — Other Ambulatory Visit: Payer: Self-pay | Admitting: Internal Medicine

## 2018-09-22 NOTE — Telephone Encounter (Signed)
Please advise per message below, last OV 08/2017  [name] would like a refill of the following medications:  FLUoxetine (PROZAC) 10 MG capsule Webb Silversmith, NP] Patient Comment: I have accepted a new job and will not have insurance for the next 3 months. Is it possible to get another 3 month supply and skip my office visit scheduled for tomorrow until I have insurance again?

## 2018-09-23 ENCOUNTER — Encounter: Payer: Managed Care, Other (non HMO) | Admitting: Internal Medicine

## 2018-09-23 MED ORDER — FLUOXETINE HCL 10 MG PO CAPS: ORAL_CAPSULE | ORAL | 0 refills | Status: DC

## 2018-09-23 NOTE — Telephone Encounter (Signed)
Will refill for additional 3 months. No further refills without appt.

## 2019-02-23 DIAGNOSIS — Z130 Encounter for screening for diseases of the blood and blood-forming organs and certain disorders involving the immune mechanism: Secondary | ICD-10-CM | POA: Diagnosis not present

## 2019-02-23 DIAGNOSIS — Z01419 Encounter for gynecological examination (general) (routine) without abnormal findings: Secondary | ICD-10-CM | POA: Diagnosis not present

## 2019-02-23 DIAGNOSIS — R82998 Other abnormal findings in urine: Secondary | ICD-10-CM | POA: Diagnosis not present

## 2019-02-23 DIAGNOSIS — Z124 Encounter for screening for malignant neoplasm of cervix: Secondary | ICD-10-CM | POA: Diagnosis not present

## 2019-06-03 ENCOUNTER — Other Ambulatory Visit: Payer: Self-pay | Admitting: Internal Medicine

## 2019-06-03 MED ORDER — FLUOXETINE HCL 10 MG PO CAPS: ORAL_CAPSULE | ORAL | 0 refills | Status: DC

## 2019-07-14 ENCOUNTER — Encounter: Payer: Self-pay | Admitting: Internal Medicine

## 2019-07-14 ENCOUNTER — Ambulatory Visit (INDEPENDENT_AMBULATORY_CARE_PROVIDER_SITE_OTHER): Payer: 59 | Admitting: Internal Medicine

## 2019-07-14 ENCOUNTER — Other Ambulatory Visit: Payer: Self-pay

## 2019-07-14 VITALS — BP 116/78 | HR 72 | Temp 98.8°F | Ht 66.0 in | Wt 166.0 lb

## 2019-07-14 DIAGNOSIS — Z0001 Encounter for general adult medical examination with abnormal findings: Secondary | ICD-10-CM

## 2019-07-14 DIAGNOSIS — K219 Gastro-esophageal reflux disease without esophagitis: Secondary | ICD-10-CM | POA: Diagnosis not present

## 2019-07-14 DIAGNOSIS — Z1509 Genetic susceptibility to other malignant neoplasm: Secondary | ICD-10-CM

## 2019-07-14 DIAGNOSIS — L989 Disorder of the skin and subcutaneous tissue, unspecified: Secondary | ICD-10-CM | POA: Diagnosis not present

## 2019-07-14 DIAGNOSIS — F411 Generalized anxiety disorder: Secondary | ICD-10-CM | POA: Diagnosis not present

## 2019-07-14 DIAGNOSIS — Z79899 Other long term (current) drug therapy: Secondary | ICD-10-CM

## 2019-07-14 MED ORDER — OMEPRAZOLE 20 MG PO CPDR: 20.0000 mg | DELAYED_RELEASE_CAPSULE | Freq: Every day | ORAL | 5 refills | Status: DC

## 2019-07-14 MED ORDER — CLONAZEPAM 1 MG PO TABS: 1.0000 mg | ORAL_TABLET | Freq: Every day | ORAL | 0 refills | Status: DC | PRN

## 2019-07-14 NOTE — Patient Instructions (Signed)
Health Maintenance, Female Adopting a healthy lifestyle and getting preventive care are important in promoting health and wellness. Ask your health care provider about:  The right schedule for you to have regular tests and exams.  Things you can do on your own to prevent diseases and keep yourself healthy. What should I know about diet, weight, and exercise? Eat a healthy diet   Eat a diet that includes plenty of vegetables, fruits, low-fat dairy products, and lean protein.  Do not eat a lot of foods that are high in solid fats, added sugars, or sodium. Maintain a healthy weight Body mass index (BMI) is used to identify weight problems. It estimates body fat based on height and weight. Your health care provider can help determine your BMI and help you achieve or maintain a healthy weight. Get regular exercise Get regular exercise. This is one of the most important things you can do for your health. Most adults should:  Exercise for at least 150 minutes each week. The exercise should increase your heart rate and make you sweat (moderate-intensity exercise).  Do strengthening exercises at least twice a week. This is in addition to the moderate-intensity exercise.  Spend less time sitting. Even light physical activity can be beneficial. Watch cholesterol and blood lipids Have your blood tested for lipids and cholesterol at 29 years of age, then have this test every 5 years. Have your cholesterol levels checked more often if:  Your lipid or cholesterol levels are high.  You are older than 29 years of age.  You are at high risk for heart disease. What should I know about cancer screening? Depending on your health history and family history, you may need to have cancer screening at various ages. This may include screening for:  Breast cancer.  Cervical cancer.  Colorectal cancer.  Skin cancer.  Lung cancer. What should I know about heart disease, diabetes, and high blood  pressure? Blood pressure and heart disease  High blood pressure causes heart disease and increases the risk of stroke. This is more likely to develop in people who have high blood pressure readings, are of African descent, or are overweight.  Have your blood pressure checked: ? Every 3-5 years if you are 18-39 years of age. ? Every year if you are 40 years old or older. Diabetes Have regular diabetes screenings. This checks your fasting blood sugar level. Have the screening done:  Once every three years after age 40 if you are at a normal weight and have a low risk for diabetes.  More often and at a younger age if you are overweight or have a high risk for diabetes. What should I know about preventing infection? Hepatitis B If you have a higher risk for hepatitis B, you should be screened for this virus. Talk with your health care provider to find out if you are at risk for hepatitis B infection. Hepatitis C Testing is recommended for:  Everyone born from 1945 through 1965.  Anyone with known risk factors for hepatitis C. Sexually transmitted infections (STIs)  Get screened for STIs, including gonorrhea and chlamydia, if: ? You are sexually active and are younger than 29 years of age. ? You are older than 29 years of age and your health care provider tells you that you are at risk for this type of infection. ? Your sexual activity has changed since you were last screened, and you are at increased risk for chlamydia or gonorrhea. Ask your health care provider if   you are at risk.  Ask your health care provider about whether you are at high risk for HIV. Your health care provider may recommend a prescription medicine to help prevent HIV infection. If you choose to take medicine to prevent HIV, you should first get tested for HIV. You should then be tested every 3 months for as long as you are taking the medicine. Pregnancy  If you are about to stop having your period (premenopausal) and  you may become pregnant, seek counseling before you get pregnant.  Take 400 to 800 micrograms (mcg) of folic acid every day if you become pregnant.  Ask for birth control (contraception) if you want to prevent pregnancy. Osteoporosis and menopause Osteoporosis is a disease in which the bones lose minerals and strength with aging. This can result in bone fractures. If you are 65 years old or older, or if you are at risk for osteoporosis and fractures, ask your health care provider if you should:  Be screened for bone loss.  Take a calcium or vitamin D supplement to lower your risk of fractures.  Be given hormone replacement therapy (HRT) to treat symptoms of menopause. Follow these instructions at home: Lifestyle  Do not use any products that contain nicotine or tobacco, such as cigarettes, e-cigarettes, and chewing tobacco. If you need help quitting, ask your health care provider.  Do not use street drugs.  Do not share needles.  Ask your health care provider for help if you need support or information about quitting drugs. Alcohol use  Do not drink alcohol if: ? Your health care provider tells you not to drink. ? You are pregnant, may be pregnant, or are planning to become pregnant.  If you drink alcohol: ? Limit how much you use to 0-1 drink a day. ? Limit intake if you are breastfeeding.  Be aware of how much alcohol is in your drink. In the [place] one drink equals one 12 oz bottle of beer (355 mL), one 5 oz glass of wine (148 mL), or one 1 oz glass of hard liquor (44 mL). General instructions  Schedule regular health, dental, and eye exams.  Stay current with your vaccines.  Tell your health care provider if: ? You often feel depressed. ? You have ever been abused or do not feel safe at home. Summary  Adopting a healthy lifestyle and getting preventive care are important in promoting health and wellness.  Follow your health care provider's instructions about healthy  diet, exercising, and getting tested or screened for diseases.  Follow your health care provider's instructions on monitoring your cholesterol and blood pressure. This information is not intended to replace advice given to you by your health care provider. Make sure you discuss any questions you have with your health care provider. Document Released: 06/10/2011 Document Revised: 11/18/2018 Document Reviewed: 11/18/2018 Elsevier Patient Education  2020 [employer] Inc.  

## 2019-07-14 NOTE — Assessment & Plan Note (Signed)
Triggered by water and chocolate Will trial Omeprazole 20 mg daily, RX sent to pharmacy Ok to continue TUMS prn Will monitor

## 2019-07-14 NOTE — Progress Notes (Signed)
Subjective:    Patient ID: Stephanie Brandt, female    DOB: Sep 10, 1991, 28 y.o.   MRN: [id number]  HPI  Pt presents to the clinic today for her annual exam. She is also due to follow up chronic conditions.  Lynch Syndrome: Getting yearly colonoscopy's. Follows with GI.  Anxiety: She reports her symptoms are fairly well controlled on Fluoxetine. She takes the Clonazepam as needed and would like a refill today. She does not see a therapist.  Flu: never Tetanus: 12/2006, but believes she had one in the last few years. Pap Smear: 2020, will request records. Colonoscopy: 06/2018 Dentist: biannually  Review of Systems      Past Medical History:  Diagnosis Date  . Anxiety   . Lynch syndrome    Risk for ovarian, uterine, & colon cancer. Yearly colonscopies with Dr. Rogers Blocker at [facility]  . No pertinent past medical history     Current Outpatient Medications  Medication Sig Dispense Refill  . clonazePAM (KLONOPIN) 1 MG tablet Take 1 tablet (1 mg total) by mouth 3 (three) times daily as needed for anxiety. 30 tablet 0  . FLUoxetine (PROZAC) 10 MG capsule TAKE ONE (1) CAPSULE BY MOUTH EACH DAY 90 capsule 0   No current facility-administered medications for this visit.     Allergies  Allergen Reactions  . Latex Hives  . Hydrocodone Hives    Family History  Problem Relation Age of Onset  . Cancer Father   . Hypertension Mother     Social History   Socioeconomic History  . Marital status: Divorced    Spouse name: Not on file  . Number of children: Not on file  . Years of education: Not on file  . Highest education level: Not on file  Occupational History  . Not on file  Social Needs  . Financial resource strain: Not on file  . Food insecurity    Worry: Not on file    Inability: Not on file  . Transportation needs    Medical: Not on file    Non-medical: Not on file  Tobacco Use  . Smoking status: Former Smoker    Types: Cigarettes  . Smokeless tobacco: Never Used  .  Tobacco comment: quit 2018  Substance and Sexual Activity  . Alcohol use: Yes  . Drug use: No  . Sexual activity: Yes    Birth control/protection: None, Pill  Lifestyle  . Physical activity    Days per week: Not on file    Minutes per session: Not on file  . Stress: Not on file  Relationships  . Social Herbalist on phone: Not on file    Gets together: Not on file    Attends religious service: Not on file    Active member of club or organization: Not on file    Attends meetings of clubs or organizations: Not on file    Relationship status: Not on file  . Intimate partner violence    Fear of current or ex partner: Not on file    Emotionally abused: Not on file    Physically abused: Not on file    Forced sexual activity: Not on file  Other Topics Concern  . Not on file  Social History Narrative  . Not on file     Constitutional: Denies fever, malaise, fatigue, headache or abrupt weight changes.  HEENT: Denies eye pain, eye redness, ear pain, ringing in the ears, wax buildup, runny nose, nasal congestion, bloody  nose, or sore throat. Respiratory: Denies difficulty breathing, shortness of breath, cough or sputum production.   Cardiovascular: Denies chest pain, chest tightness, palpitations or swelling in the hands or feet.  Gastrointestinal: Pt reports uncontrolled reflux. Denies abdominal pain, bloating, constipation, diarrhea or blood in the stool.  GU: Denies urgency, frequency, pain with urination, burning sensation, blood in urine, odor or discharge. Musculoskeletal: Denies decrease in range of motion, difficulty with gait, muscle pain or joint pain and swelling.  Skin: Pt reports skin lesion of chest. Denies redness, rashes, lesions or ulcercations.  Neurological: Denies dizziness, difficulty with memory, difficulty with speech or problems with balance and coordination.  Psych: Pt has a history of anxiety. Denies depression, SI/HI.  No other specific complaints  in a complete review of systems (except as listed in HPI above).  Objective:   Physical Exam   BP 116/78   Pulse 72   Temp 98.8 F (37.1 C) (Temporal)   Ht 5\' 6"  (1.676 m)   Wt 166 lb (75.3 kg)   LMP 07/05/2019   SpO2 98%   BMI 26.79 kg/m  Wt Readings from Last 3 Encounters:  07/14/19 166 lb (75.3 kg)  08/13/17 154 lb 8 oz (70.1 kg)  06/02/17 153 lb (69.4 kg)    General: Appears her stated age, well developed, well nourished in NAD. Skin: Warm, dry. Raised ulceration lesion noted over sternum. No drainage noted. HEENT: Head: normal shape and size; Eyes: sclera white, no icterus, conjunctiva pink, PERRLA and EOMs intact; Ears: Tm's gray and intact, normal light reflex;  Neck:  Neck supple, trachea midline. No masses, lumps or thyromegaly present.  Cardiovascular: Normal rate and rhythm. S1,S2 noted.  No murmur, rubs or gallops noted. No JVD or BLE edema.  Pulmonary/Chest: Normal effort and positive vesicular breath sounds. No respiratory distress. No wheezes, rales or ronchi noted.  Abdomen: Soft and nontender. Normal bowel sounds. No distention or masses noted. Liver, spleen and kidneys non palpable. Musculoskeletal: Strength 5/5 BUE/BLE. No difficulty with gait.  Neurological: Alert and oriented. Cranial nerves II-XII grossly intact. Coordination normal.  Psychiatric: Mood and affect normal. Behavior is normal. Judgment and thought content normal.     BMET    Component Value Date/Time   NA 138 06/02/2017 1514   NA 138 03/29/2014 0638   K 4.4 06/02/2017 1514   K 3.7 03/29/2014 0638   CL 100 06/02/2017 1514   CL 107 03/29/2014 0638   CO2 21 06/02/2017 1514   CO2 26 03/29/2014 0638   GLUCOSE 56 (L) 06/02/2017 1514   GLUCOSE 102 (H) 04/04/2017 1741   GLUCOSE 108 (H) 03/29/2014 0638   BUN 11 06/02/2017 1514   BUN 16 03/29/2014 0638   CREATININE 0.57 06/02/2017 1514   CREATININE 0.72 03/29/2014 0638   CALCIUM 9.0 06/02/2017 1514   CALCIUM 8.8 03/29/2014 0638    GFRNONAA 127 06/02/2017 1514   GFRNONAA >60 03/29/2014 0638   GFRAA 147 06/02/2017 1514   GFRAA >60 03/29/2014 0638    Lipid Panel     Component Value Date/Time   CHOL 113 06/02/2017 1514   TRIG 118 06/02/2017 1514   HDL 32 (L) 06/02/2017 1514   CHOLHDL 3.5 06/02/2017 1514   LDLCALC 57 06/02/2017 1514    CBC    Component Value Date/Time   WBC 8.2 06/02/2017 1514   WBC 8.4 04/04/2017 1741   RBC 4.92 06/02/2017 1514   RBC 4.86 04/04/2017 1741   HGB 13.6 06/02/2017 1514   HCT 41.2 06/02/2017  1514   PLT 308 06/02/2017 1514   MCV 84 06/02/2017 1514   MCV 80 03/29/2014 0638   MCH 27.6 06/02/2017 1514   MCH 28.4 04/04/2017 1741   MCHC 33.0 06/02/2017 1514   MCHC 34.4 04/04/2017 1741   RDW 13.3 06/02/2017 1514   RDW 15.6 (H) 03/29/2014 0638   LYMPHSABS 2.3 04/04/2017 1741   LYMPHSABS 0.6 (L) 03/29/2014 0638   MONOABS 0.7 04/04/2017 1741   MONOABS 0.7 03/29/2014 0638   EOSABS 0.1 04/04/2017 1741   EOSABS 0.1 03/29/2014 0638   BASOSABS 0.1 04/04/2017 1741   BASOSABS 0.0 03/29/2014 0638    Hgb A1C No results found for: HGBA1C         Assessment & Plan:   Preventative Health Maintenance:  Encouraged her to get a flu shot in the fall Tetanus UTD per her report, will request records Pap smear UTD Encouraged her to consume a balanced diet and exercise regimen Advised her to see a dentist annually Will check CBC, CMET, Lipid profile today. She declines STD screening  Skin Lesion of Chest Wall:  Monitor for now If no improvement in 2 weeks, consider derm referral RTC in 1 year, sooner if needed Webb Silversmith, NP

## 2019-07-14 NOTE — Assessment & Plan Note (Signed)
Continue Fluoxetine and Clonazepam Clonazepam refilled today Support offered CSA and UDS  today

## 2019-07-14 NOTE — Assessment & Plan Note (Signed)
Continue yearly colonoscopy Will monitor

## 2019-07-15 LAB — PAIN MGMT, PROFILE 8 W/CONF, U
6 Acetylmorphine: NEGATIVE ng/mL
Alcohol Metabolites: NEGATIVE ng/mL (ref <500)
Amphetamines: NEGATIVE ng/mL
Benzodiazepines: NEGATIVE ng/mL
Buprenorphine, Urine: NEGATIVE ng/mL
Cocaine Metabolite: NEGATIVE ng/mL
Creatinine: 130.2 mg/dL
MDMA: NEGATIVE ng/mL
Marijuana Metabolite: NEGATIVE ng/mL
Opiates: NEGATIVE ng/mL
Oxidant: NEGATIVE ug/mL
Oxycodone: NEGATIVE ng/mL
pH: 7.1 (ref 4.5–9.0)

## 2019-07-15 LAB — CBC
HCT: 42.6 % (ref 36.0–46.0)
Hemoglobin: 14.6 g/dL (ref 12.0–15.0)
MCHC: 34.2 g/dL (ref 30.0–36.0)
MCV: 82.3 fl (ref 78.0–100.0)
Platelets: 272 10*3/uL (ref 150.0–400.0)
RBC: 5.18 Mil/uL — ABNORMAL HIGH (ref 3.87–5.11)
RDW: 13.4 % (ref 11.5–15.5)
WBC: 8 10*3/uL (ref 4.0–10.5)

## 2019-07-15 LAB — COMPREHENSIVE METABOLIC PANEL
ALT: 20 U/L (ref 0–35)
AST: 16 U/L (ref 0–37)
Albumin: 4.4 g/dL (ref 3.5–5.2)
Alkaline Phosphatase: 68 U/L (ref 39–117)
BUN: 9 mg/dL (ref 6–23)
CO2: 27 mEq/L (ref 19–32)
Calcium: 9.4 mg/dL (ref 8.4–10.5)
Chloride: 104 mEq/L (ref 96–112)
Creatinine, Ser: 0.75 mg/dL (ref 0.40–1.20)
GFR: 91.14 mL/min (ref >60.00)
Glucose, Bld: 77 mg/dL (ref 70–99)
Potassium: 4.3 mEq/L (ref 3.5–5.1)
Sodium: 138 mEq/L (ref 135–145)
Total Bilirubin: 0.3 mg/dL (ref 0.2–1.2)
Total Protein: 7.9 g/dL (ref 6.0–8.3)

## 2019-07-15 LAB — LIPID PANEL
Cholesterol: 132 mg/dL (ref 0–200)
HDL: 39.3 mg/dL (ref >39.00)
LDL Cholesterol: 63 mg/dL (ref 0–99)
NonHDL: 92.24
Total CHOL/HDL Ratio: 3
Triglycerides: 144 mg/dL (ref 0.0–149.0)
VLDL: 28.8 mg/dL (ref 0.0–40.0)

## 2019-12-06 ENCOUNTER — Ambulatory Visit: Payer: 59 | Attending: Internal Medicine

## 2019-12-06 DIAGNOSIS — U071 COVID-19: Secondary | ICD-10-CM

## 2019-12-06 DIAGNOSIS — Z20822 Contact with and (suspected) exposure to covid-19: Secondary | ICD-10-CM

## 2019-12-06 HISTORY — DX: COVID-19: U07.1

## 2019-12-08 ENCOUNTER — Other Ambulatory Visit: Payer: 59

## 2019-12-08 LAB — NOVEL CORONAVIRUS, NAA: SARS-CoV-2, NAA: DETECTED — AB

## 2019-12-17 ENCOUNTER — Other Ambulatory Visit: Payer: Self-pay | Admitting: Internal Medicine

## 2019-12-17 MED ORDER — FLUOXETINE HCL 10 MG PO CAPS: ORAL_CAPSULE | ORAL | 0 refills | Status: DC

## 2020-04-04 ENCOUNTER — Other Ambulatory Visit: Payer: Self-pay | Admitting: Internal Medicine

## 2020-05-26 LAB — HM COLONOSCOPY

## 2020-07-25 ENCOUNTER — Encounter: Payer: 59 | Admitting: Internal Medicine

## 2020-07-25 NOTE — Progress Notes (Deleted)
Subjective:    Patient ID: Stephanie Brandt, female    DOB: [birth year], 29 y.o.   MRN: [id number]  HPI  Patient presents the clinic today for her annual exam.  She is also due to follow-up chronic conditions.  Lynch syndrome: She gets colonoscopies yearly.  She is following with GI.  Anxiety: Managed on Fluoxetine and Clonazepam as needed.  She is not currently seeing a therapist.  She denies depression, SI/HI.  Flu: Tetanus: 12/2006 Covid: Pap smear: Dentist:  Diet: Exercise:  Review of Systems      Past Medical History:  Diagnosis Date  . Anxiety   . Lynch syndrome    Risk for ovarian, uterine, & colon cancer. Yearly colonscopies with Dr. Rogers Blocker at [facility]  . No pertinent past medical history     Current Outpatient Medications  Medication Sig Dispense Refill  . clonazePAM (KLONOPIN) 1 MG tablet Take 1 tablet (1 mg total) by mouth daily as needed for anxiety. 30 tablet 0  . FLUoxetine (PROZAC) 10 MG capsule TAKE ONE (1) CAPSULE BY MOUTH EACH DAY 30 capsule 2  . omeprazole (PRILOSEC) 20 MG capsule Take 1 capsule (20 mg total) by mouth daily. 30 capsule 5   No current facility-administered medications for this visit.    Allergies  Allergen Reactions  . Latex Hives  . Hydrocodone Hives    Family History  Problem Relation Age of Onset  . Cancer Father   . Hypertension Mother     Social History   Socioeconomic History  . Marital status: Divorced    Spouse name: Not on file  . Number of children: Not on file  . Years of education: Not on file  . Highest education level: Not on file  Occupational History  . Not on file  Tobacco Use  . Smoking status: Former Smoker    Types: Cigarettes  . Smokeless tobacco: Never Used  . Tobacco comment: quit 2018  Vaping Use  . Vaping Use: Never used  Substance and Sexual Activity  . Alcohol use: Yes  . Drug use: No  . Sexual activity: Yes    Birth control/protection: None, Pill  Other Topics Concern  . Not on file    Social History Narrative  . Not on file   Social Determinants of Health   Financial Resource Strain:   . Difficulty of Paying Living Expenses:   Food Insecurity:   . Worried About Charity fundraiser in the Last Year:   . Arboriculturist in the Last Year:   Transportation Needs:   . Film/video editor (Medical):   Marland Kitchen Lack of Transportation (Non-Medical):   Physical Activity:   . Days of Exercise per Week:   . Minutes of Exercise per Session:   Stress:   . Feeling of Stress :   Social Connections:   . Frequency of Communication with Friends and Family:   . Frequency of Social Gatherings with Friends and Family:   . Attends Religious Services:   . Active Member of Clubs or Organizations:   . Attends Archivist Meetings:   Marland Kitchen Marital Status:   Intimate Partner Violence:   . Fear of Current or Ex-Partner:   . Emotionally Abused:   Marland Kitchen Physically Abused:   . Sexually Abused:      Constitutional: Denies fever, malaise, fatigue, headache or abrupt weight changes.  HEENT: Denies eye pain, eye redness, ear pain, ringing in the ears, wax buildup, runny nose, nasal congestion, bloody  nose, or sore throat. Respiratory: Denies difficulty breathing, shortness of breath, cough or sputum production.   Cardiovascular: Denies chest pain, chest tightness, palpitations or swelling in the hands or feet.  Gastrointestinal: Denies abdominal pain, bloating, constipation, diarrhea or blood in the stool.  GU: Denies urgency, frequency, pain with urination, burning sensation, blood in urine, odor or discharge. Musculoskeletal: Denies decrease in range of motion, difficulty with gait, muscle pain or joint pain and swelling.  Skin: Denies redness, rashes, lesions or ulcercations.  Neurological: Denies dizziness, difficulty with memory, difficulty with speech or problems with balance and coordination.  Psych: Patient has a history of anxiety.  Denies depression, SI/HI.  No other specific  complaints in a complete review of systems (except as listed in HPI above).  Objective:   Physical Exam   There were no vitals taken for this visit. Wt Readings from Last 3 Encounters:  07/14/19 166 lb (75.3 kg)  08/13/17 154 lb 8 oz (70.1 kg)  06/02/17 153 lb (69.4 kg)    General: Appears their stated age, well developed, well nourished in NAD. Skin: Warm, dry and intact. No rashes, lesions or ulcerations noted. HEENT: Head: normal shape and size; Eyes: sclera white, no icterus, conjunctiva pink, PERRLA and EOMs intact; Ears: Tm's gray and intact, normal light reflex; Nose: mucosa pink and moist, septum midline; Throat/Mouth: Teeth present, mucosa pink and moist, no exudate, lesions or ulcerations noted.  Neck:  Neck supple, trachea midline. No masses, lumps or thyromegaly present.  Cardiovascular: Normal rate and rhythm. S1,S2 noted.  No murmur, rubs or gallops noted. No JVD or BLE edema. No carotid bruits noted. Pulmonary/Chest: Normal effort and positive vesicular breath sounds. No respiratory distress. No wheezes, rales or ronchi noted.  Abdomen: Soft and nontender. Normal bowel sounds. No distention or masses noted. Liver, spleen and kidneys non palpable. Musculoskeletal: Normal range of motion. No signs of joint swelling. No difficulty with gait.  Neurological: Alert and oriented. Cranial nerves II-XII grossly intact. Coordination normal.  Psychiatric: Mood and affect normal. Behavior is normal. Judgment and thought content normal.     BMET    Component Value Date/Time   NA 138 07/14/2019 1601   NA 138 06/02/2017 1514   NA 138 03/29/2014 0638   K 4.3 07/14/2019 1601   K 3.7 03/29/2014 0638   CL 104 07/14/2019 1601   CL 107 03/29/2014 0638   CO2 27 07/14/2019 1601   CO2 26 03/29/2014 0638   GLUCOSE 77 07/14/2019 1601   GLUCOSE 108 (H) 03/29/2014 0638   BUN 9 07/14/2019 1601   BUN 11 06/02/2017 1514   BUN 16 03/29/2014 0638   CREATININE 0.75 07/14/2019 1601    CREATININE 0.72 03/29/2014 0638   CALCIUM 9.4 07/14/2019 1601   CALCIUM 8.8 03/29/2014 0638   GFRNONAA 127 06/02/2017 1514   GFRNONAA >60 03/29/2014 0638   GFRAA 147 06/02/2017 1514   GFRAA >60 03/29/2014 0638    Lipid Panel     Component Value Date/Time   CHOL 132 07/14/2019 1601   CHOL 113 06/02/2017 1514   TRIG 144.0 07/14/2019 1601   HDL 39.30 07/14/2019 1601   HDL 32 (L) 06/02/2017 1514   CHOLHDL 3 07/14/2019 1601   VLDL 28.8 07/14/2019 1601   LDLCALC 63 07/14/2019 1601   LDLCALC 57 06/02/2017 1514    CBC    Component Value Date/Time   WBC 8.0 07/14/2019 1601   RBC 5.18 (H) 07/14/2019 1601   HGB 14.6 07/14/2019 1601   HGB  13.6 06/02/2017 1514   HCT 42.6 07/14/2019 1601   HCT 41.2 06/02/2017 1514   PLT 272.0 07/14/2019 1601   PLT 308 06/02/2017 1514   MCV 82.3 07/14/2019 1601   MCV 84 06/02/2017 1514   MCV 80 03/29/2014 0638   MCH 27.6 06/02/2017 1514   MCH 28.4 04/04/2017 1741   MCHC 34.2 07/14/2019 1601   RDW 13.4 07/14/2019 1601   RDW 13.3 06/02/2017 1514   RDW 15.6 (H) 03/29/2014 0638   LYMPHSABS 2.3 04/04/2017 1741   LYMPHSABS 0.6 (L) 03/29/2014 0638   MONOABS 0.7 04/04/2017 1741   MONOABS 0.7 03/29/2014 0638   EOSABS 0.1 04/04/2017 1741   EOSABS 0.1 03/29/2014 0638   BASOSABS 0.1 04/04/2017 1741   BASOSABS 0.0 03/29/2014 0638    Hgb A1C No results found for: HGBA1C         Assessment & Plan:   Preventative Health Maintenance:  Encouraged her to get a flu shot in the fall Tetanus Covid Pap smear Encouraged her to consume a balanced diet and exercise regimen Advised her to see an eye doctor and dentist annually We will check CBC, C met, lipid profile today  RTC in 1 year, sooner if needed Webb Silversmith, NP This visit occurred during the SARS-CoV-2 public health emergency.  Safety protocols were in place, including screening questions prior to the visit, additional usage of staff PPE, and extensive cleaning of exam room while observing  appropriate contact time as indicated for disinfecting solutions.

## 2020-09-21 ENCOUNTER — Ambulatory Visit (INDEPENDENT_AMBULATORY_CARE_PROVIDER_SITE_OTHER): Payer: 59

## 2020-09-21 ENCOUNTER — Other Ambulatory Visit: Payer: Self-pay

## 2020-09-21 ENCOUNTER — Encounter: Payer: Self-pay | Admitting: Podiatry

## 2020-09-21 ENCOUNTER — Ambulatory Visit: Payer: 59 | Admitting: Podiatry

## 2020-09-21 DIAGNOSIS — M779 Enthesopathy, unspecified: Secondary | ICD-10-CM

## 2020-09-21 DIAGNOSIS — M7671 Peroneal tendinitis, right leg: Secondary | ICD-10-CM

## 2020-09-21 DIAGNOSIS — N898 Other specified noninflammatory disorders of vagina: Secondary | ICD-10-CM | POA: Insufficient documentation

## 2020-09-21 DIAGNOSIS — N926 Irregular menstruation, unspecified: Secondary | ICD-10-CM | POA: Insufficient documentation

## 2020-09-22 ENCOUNTER — Encounter: Payer: Self-pay | Admitting: Podiatry

## 2020-09-22 NOTE — Progress Notes (Signed)
Subjective:  Patient ID: Stephanie Brandt, female    DOB: 22-Dec-1990,  MRN: [id number]  Chief Complaint  Patient presents with  . Foot Pain    patient presents today for right lateral side foot pain x 2 weeks.  She says its a burning sensation and the pain is worse at night    29 y.o. female presents with the above complaint.  Patient presents with complaint of right knee pain to the lateral foot.  Patient states is been going on for 2 weeks has progressive gotten worse.  There are some burning sensation associated with it as well.  She has not tried any treatment.  Is tender to touch worse at night.  It comes and goes.  She states it hurts after ambulating for some period of time.  She denies any other acute complaints.  She would like to discuss treatment options.  She has not seen anyone else prior to seeing me.  Her pain scale is 10 out of 10 at its worse   Review of Systems: Negative except as noted in the HPI. Denies N/V/F/Ch.  Past Medical History:  Diagnosis Date  . Anxiety   . Lynch syndrome    Risk for ovarian, uterine, & colon cancer. Yearly colonscopies with Dr. Rogers Blocker at [facility]  . No pertinent past medical history     Current Outpatient Medications:  .  clonazePAM (KLONOPIN) 1 MG tablet, Take 1 tablet (1 mg total) by mouth daily as needed for anxiety., Disp: 30 tablet, Rfl: 0 .  FLUoxetine (PROZAC) 10 MG capsule, TAKE ONE (1) CAPSULE BY MOUTH EACH DAY, Disp: 30 capsule, Rfl: 2 .  ibuprofen (ADVIL) 200 MG tablet, Take by mouth., Disp: , Rfl:  .  norethindrone-ethinyl estradiol (LOESTRIN FE) 1-20 MG-MCG tablet, Take 1 tablet by mouth daily., Disp: , Rfl:  .  omeprazole (PRILOSEC) 20 MG capsule, Take 1 capsule (20 mg total) by mouth daily., Disp: 30 capsule, Rfl: 5  Social History   Tobacco Use  Smoking Status Former Smoker  . Types: Cigarettes  Smokeless Tobacco Never Used  Tobacco Comment   quit 2018    Allergies  Allergen Reactions  . Latex Hives  . Hydrocodone  Hives   Objective:  There were no vitals filed for this visit. There is no height or weight on file to calculate BMI. Constitutional Well developed. Well nourished.  Vascular Dorsalis pedis pulses palpable bilaterally. Posterior tibial pulses palpable bilaterally. Capillary refill normal to all digits.  No cyanosis or clubbing noted. Pedal hair growth normal.  Neurologic Normal speech. Oriented to person, place, and time. Epicritic sensation to light touch grossly present bilaterally.  Dermatologic Nails well groomed and normal in appearance. No open wounds. No skin lesions.  Orthopedic:  Pain on palpation along the course of the peroneal tendon including its insertion into the fifth metatarsal base.  Pain with dorsiflexion eversion resisted active and passive.  No pain with plantarflexion inversion of the foot.  No pain at the Achilles tendon, posterior tibial tendon, ATFL ligament.   Radiographs: 3 views of skeletally mature adult right foot: No osseous abnormalities noted.  No stress fracture noted.  No ossicles identified.  Mild posterior heel spurring noted. Assessment:   1. Peroneal tendinitis, right    Plan:  Patient was evaluated and treated and all questions answered.  Right peroneal tendinitis -I explained to patient the etiology of peroneal tendinitis and various treatment options were discussed.  Given the amount of pain that she is having I  believe patient will benefit from complete immobilization of the foot with a cam boot.  Patient states understanding.   -Cam boot was dispensed. -If there is no resolve meant of pain we'll discuss getting MRI versus possible steroid injection.   No follow-ups on file.

## 2020-09-26 ENCOUNTER — Ambulatory Visit (INDEPENDENT_AMBULATORY_CARE_PROVIDER_SITE_OTHER): Payer: 59 | Admitting: Internal Medicine

## 2020-09-26 ENCOUNTER — Encounter: Payer: Self-pay | Admitting: Internal Medicine

## 2020-09-26 ENCOUNTER — Telehealth: Payer: Self-pay

## 2020-09-26 ENCOUNTER — Other Ambulatory Visit: Payer: Self-pay

## 2020-09-26 VITALS — BP 122/76 | HR 66 | Temp 97.4°F | Ht 66.0 in | Wt 165.0 lb

## 2020-09-26 DIAGNOSIS — Z0000 Encounter for general adult medical examination without abnormal findings: Secondary | ICD-10-CM | POA: Diagnosis not present

## 2020-09-26 DIAGNOSIS — K219 Gastro-esophageal reflux disease without esophagitis: Secondary | ICD-10-CM | POA: Diagnosis not present

## 2020-09-26 DIAGNOSIS — F411 Generalized anxiety disorder: Secondary | ICD-10-CM

## 2020-09-26 DIAGNOSIS — Z1509 Genetic susceptibility to other malignant neoplasm: Secondary | ICD-10-CM

## 2020-09-26 MED ORDER — FLUOXETINE HCL 20 MG PO TABS: 20.0000 mg | ORAL_TABLET | Freq: Every day | ORAL | 3 refills | Status: DC

## 2020-09-26 NOTE — Assessment & Plan Note (Signed)
Increase Fluoxetine to 20 mg daily Continue Clonazepam as needed Support offered

## 2020-09-26 NOTE — Patient Instructions (Signed)
Health Maintenance, Female Adopting a healthy lifestyle and getting preventive care are important in promoting health and wellness. Ask your health care provider about:  The right schedule for you to have regular tests and exams.  Things you can do on your own to prevent diseases and keep yourself healthy. What should I know about diet, weight, and exercise? Eat a healthy diet   Eat a diet that includes plenty of vegetables, fruits, low-fat dairy products, and lean protein.  Do not eat a lot of foods that are high in solid fats, added sugars, or sodium. Maintain a healthy weight Body mass index (BMI) is used to identify weight problems. It estimates body fat based on height and weight. Your health care provider can help determine your BMI and help you achieve or maintain a healthy weight. Get regular exercise Get regular exercise. This is one of the most important things you can do for your health. Most adults should:  Exercise for at least 150 minutes each week. The exercise should increase your heart rate and make you sweat (moderate-intensity exercise).  Do strengthening exercises at least twice a week. This is in addition to the moderate-intensity exercise.  Spend less time sitting. Even light physical activity can be beneficial. Watch cholesterol and blood lipids Have your blood tested for lipids and cholesterol at 30 years of age, then have this test every 5 years. Have your cholesterol levels checked more often if:  Your lipid or cholesterol levels are high.  You are older than 30 years of age.  You are at high risk for heart disease. What should I know about cancer screening? Depending on your health history and family history, you may need to have cancer screening at various ages. This may include screening for:  Breast cancer.  Cervical cancer.  Colorectal cancer.  Skin cancer.  Lung cancer. What should I know about heart disease, diabetes, and high blood  pressure? Blood pressure and heart disease  High blood pressure causes heart disease and increases the risk of stroke. This is more likely to develop in people who have high blood pressure readings, are of African descent, or are overweight.  Have your blood pressure checked: ? Every 3-5 years if you are 18-39 years of age. ? Every year if you are 40 years old or older. Diabetes Have regular diabetes screenings. This checks your fasting blood sugar level. Have the screening done:  Once every three years after age 40 if you are at a normal weight and have a low risk for diabetes.  More often and at a younger age if you are overweight or have a high risk for diabetes. What should I know about preventing infection? Hepatitis B If you have a higher risk for hepatitis B, you should be screened for this virus. Talk with your health care provider to find out if you are at risk for hepatitis B infection. Hepatitis C Testing is recommended for:  Everyone born from 1945 through 1965.  Anyone with known risk factors for hepatitis C. Sexually transmitted infections (STIs)  Get screened for STIs, including gonorrhea and chlamydia, if: ? You are sexually active and are younger than 30 years of age. ? You are older than 30 years of age and your health care provider tells you that you are at risk for this type of infection. ? Your sexual activity has changed since you were last screened, and you are at increased risk for chlamydia or gonorrhea. Ask your health care provider if   you are at risk.  Ask your health care provider about whether you are at high risk for HIV. Your health care provider may recommend a prescription medicine to help prevent HIV infection. If you choose to take medicine to prevent HIV, you should first get tested for HIV. You should then be tested every 3 months for as long as you are taking the medicine. Pregnancy  If you are about to stop having your period (premenopausal) and  you may become pregnant, seek counseling before you get pregnant.  Take 400 to 800 micrograms (mcg) of folic acid every day if you become pregnant.  Ask for birth control (contraception) if you want to prevent pregnancy. Osteoporosis and menopause Osteoporosis is a disease in which the bones lose minerals and strength with aging. This can result in bone fractures. If you are 65 years old or older, or if you are at risk for osteoporosis and fractures, ask your health care provider if you should:  Be screened for bone loss.  Take a calcium or vitamin D supplement to lower your risk of fractures.  Be given hormone replacement therapy (HRT) to treat symptoms of menopause. Follow these instructions at home: Lifestyle  Do not use any products that contain nicotine or tobacco, such as cigarettes, e-cigarettes, and chewing tobacco. If you need help quitting, ask your health care provider.  Do not use street drugs.  Do not share needles.  Ask your health care provider for help if you need support or information about quitting drugs. Alcohol use  Do not drink alcohol if: ? Your health care provider tells you not to drink. ? You are pregnant, may be pregnant, or are planning to become pregnant.  If you drink alcohol: ? Limit how much you use to 0-1 drink a day. ? Limit intake if you are breastfeeding.  Be aware of how much alcohol is in your drink. In the [place] one drink equals one 12 oz bottle of beer (355 mL), one 5 oz glass of wine (148 mL), or one 1 oz glass of hard liquor (44 mL). General instructions  Schedule regular health, dental, and eye exams.  Stay current with your vaccines.  Tell your health care provider if: ? You often feel depressed. ? You have ever been abused or do not feel safe at home. Summary  Adopting a healthy lifestyle and getting preventive care are important in promoting health and wellness.  Follow your health care provider's instructions about healthy  diet, exercising, and getting tested or screened for diseases.  Follow your health care provider's instructions on monitoring your cholesterol and blood pressure. This information is not intended to replace advice given to you by your health care provider. Make sure you discuss any questions you have with your health care provider. Document Revised: 11/18/2018 Document Reviewed: 11/18/2018 Elsevier Patient Education  2020 [employer]  

## 2020-09-26 NOTE — Progress Notes (Signed)
Subjective:    Patient ID: Stephanie Brandt, female    DOB: June 23, 1991, 29 y.o.   MRN: [id number]  HPI  Pt presents to the clinic today for her annual exam. She is also due to follow up chronic conditions.  Lynch Syndrome: She gets a colonoscopy yearly. She follows with GI.  Anxiety: Deteriorated in the last few months, managed on Fluoxetine. She reports this is due to the custody case with her son. She takes Clonazepam as needed but has noticed that she is using this more. She does not see a therapist. She denies depression SI/HI.  GERD: Intermittent, currently not an issue. She takes Omeprazole as needed.  Flu: never Tetanus: 12/2006 Covid: never Pap Smear: 2020 Dentist: biannually  Diet: She does eat meat. She consumes fruits and veggies daily. She tries to avoid fried foods. She drinks mostly flavored water, mini Dr. Malachi Brandt. Exercise:  Review of Systems   Past Medical History:  Diagnosis Date  . Anxiety   . Lynch syndrome    Risk for ovarian, uterine, & colon cancer. Yearly colonscopies with Dr[facility] Rogers Brand[facility] at [facility]  . No pertinent past medical history     Current Outpatient Medications  Medication Sig Dispense Refill  . clonazePAM (KLONOPIN) 1 MG tablet Take 1 tablet (1 mg total) by mouth daily as needed for anxiety. 30 tablet 0  . FLUoxetine (PROZAC) 10 MG capsule TAKE ONE (1) CAPSULE BY MOUTH EACH DAY 30 capsule 2  . ibuprofen (ADVIL) 200 MG tablet Take by mouth.    . norethindrone-ethinyl estradiol (LOESTRIN FE) 1-20 MG-MCG tablet Take 1 tablet by mouth daily.    Marland Kitchen omeprazole (PRILOSEC) 20 MG capsule Take 1 capsule (20 mg total) by mouth daily. 30 capsule 5   No current facility-administered medications for this visit.    Allergies  Allergen Reactions  . Latex Hives  . Hydrocodone Hives    Family History  Problem Relation Age of Onset  . Cancer Father   . Hypertension Mother     Social History   Socioeconomic History  . Marital status: Divorced    Spouse  name: Not on file  . Number of children: Not on file  . Years of education: Not on file  . Highest education level: Not on file  Occupational History  . Not on file  Tobacco Use  . Smoking status: Former Smoker    Types: Cigarettes  . Smokeless tobacco: Never Used  . Tobacco comment: quit 2018  Vaping Use  . Vaping Use: Never used  Substance and Sexual Activity  . Alcohol use: Yes  . Drug use: No  . Sexual activity: Yes    Birth control/protection: None, Pill  Other Topics Concern  . Not on file  Social History Narrative  . Not on file   Social Determinants of Health   Financial Resource Strain:   . Difficulty of Paying Living Expenses: Not on file  Food Insecurity:   . Worried About Charity fundraiser in the Last Year: Not on file  . Ran Out of Food in the Last Year: Not on file  Transportation Needs:   . Lack of Transportation (Medical): Not on file  . Lack of Transportation (Non-Medical): Not on file  Physical Activity:   . Days of Exercise per Week: Not on file  . Minutes of Exercise per Session: Not on file  Stress:   . Feeling of Stress : Not on file  Social Connections:   . Frequency of Communication  with Friends and Family: Not on file  . Frequency of Social Gatherings with Friends and Family: Not on file  . Attends Religious Services: Not on file  . Active Member of Clubs or Organizations: Not on file  . Attends Archivist Meetings: Not on file  . Marital Status: Not on file  Intimate Partner Violence:   . Fear of Current or Ex-Partner: Not on file  . Emotionally Abused: Not on file  . Physically Abused: Not on file  . Sexually Abused: Not on file     Constitutional: Denies fever, malaise, fatigue, headache or abrupt weight changes.  HEENT: Denies eye pain, eye redness, ear pain, ringing in the ears, wax buildup, runny nose, nasal congestion, bloody nose, or sore throat. Respiratory: Denies difficulty breathing, shortness of breath, cough or  sputum production.   Cardiovascular: Denies chest pain, chest tightness, palpitations or swelling in the hands or feet.  Gastrointestinal: Denies abdominal pain, bloating, constipation, diarrhea or blood in the stool.  GU: Denies urgency, frequency, pain with urination, burning sensation, blood in urine, odor or discharge. Musculoskeletal: Denies decrease in range of motion, difficulty with gait, muscle pain or joint pain and swelling.  Skin: Denies redness, rashes, lesions or ulcercations.  Neurological: Denies dizziness, difficulty with memory, difficulty with speech or problems with balance and coordination.  Psych: Pt has a history of anxiety.  Denies depression, SI/HI.  No other specific complaints in a complete review of systems (except as listed in HPI above).   Objective:   Physical Exam   BP 122/76   Pulse 66   Temp (!) 97.4 F (36.3 C) (Temporal)   Ht 5\' 6"  (1.676 m)   Wt 165 lb (74.8 kg)   SpO2 98%   BMI 26.63 kg/m   Wt Readings from Last 3 Encounters:  07/14/19 166 lb (75.3 kg)  08/13/17 154 lb 8 oz (70.1 kg)  06/02/17 153 lb (69.4 kg)    General: Appears her stated age, well developed, well nourished in NAD. Skin: Warm, dry and intact. No rashes, lesions or ulcerations noted. HEENT: Head: normal shape and size; Eyes: sclera white, no icterus, conjunctiva pink, PERRLA and EOMs intact;  Neck:  Neck supple, trachea midline. No masses, lumps or thyromegaly present.  Cardiovascular: Normal rate and rhythm. S1,S2 noted.  No murmur, rubs or gallops noted. No JVD or BLE edema. Pulmonary/Chest: Normal effort and positive vesicular breath sounds. No respiratory distress. No wheezes, rales or ronchi noted.  Abdomen: Soft and nontender. Normal bowel sounds. No distention or masses noted. Liver, spleen and kidneys non palpable. Musculoskeletal: Right ankle in boot. No difficulty with gait.  Neurological: Alert and oriented. Cranial nerves II-XII grossly intact. Coordination  normal.  Psychiatric: Mood and affect normal. Behavior is normal. Judgment and thought content normal.    BMET    Component Value Date/Time   NA 138 07/14/2019 1601   NA 138 06/02/2017 1514   NA 138 03/29/2014 0638   K 4.3 07/14/2019 1601   K 3.7 03/29/2014 0638   CL 104 07/14/2019 1601   CL 107 03/29/2014 0638   CO2 27 07/14/2019 1601   CO2 26 03/29/2014 0638   GLUCOSE 77 07/14/2019 1601   GLUCOSE 108 (H) 03/29/2014 0638   BUN 9 07/14/2019 1601   BUN 11 06/02/2017 1514   BUN 16 03/29/2014 0638   CREATININE 0.75 07/14/2019 1601   CREATININE 0.72 03/29/2014 0638   CALCIUM 9.4 07/14/2019 1601   CALCIUM 8.8 03/29/2014 5397  GFRNONAA 127 06/02/2017 1514   GFRNONAA >60 03/29/2014 0638   GFRAA 147 06/02/2017 1514   GFRAA >60 03/29/2014 0638    Lipid Panel     Component Value Date/Time   CHOL 132 07/14/2019 1601   CHOL 113 06/02/2017 1514   TRIG 144.0 07/14/2019 1601   HDL 39.30 07/14/2019 1601   HDL 32 (L) 06/02/2017 1514   CHOLHDL 3 07/14/2019 1601   VLDL 28.8 07/14/2019 1601   LDLCALC 63 07/14/2019 1601   LDLCALC 57 06/02/2017 1514    CBC    Component Value Date/Time   WBC 8.0 07/14/2019 1601   RBC 5.18 (H) 07/14/2019 1601   HGB 14.6 07/14/2019 1601   HGB 13.6 06/02/2017 1514   HCT 42.6 07/14/2019 1601   HCT 41.2 06/02/2017 1514   PLT 272.0 07/14/2019 1601   PLT 308 06/02/2017 1514   MCV 82.3 07/14/2019 1601   MCV 84 06/02/2017 1514   MCV 80 03/29/2014 0638   MCH 27.6 06/02/2017 1514   MCH 28.4 04/04/2017 1741   MCHC 34.2 07/14/2019 1601   RDW 13.4 07/14/2019 1601   RDW 13.3 06/02/2017 1514   RDW 15.6 (H) 03/29/2014 0638   LYMPHSABS 2.3 04/04/2017 1741   LYMPHSABS 0.6 (L) 03/29/2014 0638   MONOABS 0.7 04/04/2017 1741   MONOABS 0.7 03/29/2014 0638   EOSABS 0.1 04/04/2017 1741   EOSABS 0.1 03/29/2014 0638   BASOSABS 0.1 04/04/2017 1741   BASOSABS 0.0 03/29/2014 0638    Hgb A1C No results found for: HGBA1C        Assessment & Plan:    Preventative Health Maintenance:  She declines flu shot Tetanus UTD per her report She declines covid vaccine Pap smear UTD, will request copy Encouraged her to consume a balanced diet and exercise regimen Advised her to see a dentist annually Will check CBC, CMET, Lipid profile today  RTC in 1 year, sooner if needed Webb Silversmith, NP This visit occurred during the SARS-CoV-2 public health emergency.  Safety protocols were in place, including screening questions prior to the visit, additional usage of staff PPE, and extensive cleaning of exam room while observing appropriate contact time as indicated for disinfecting solutions.

## 2020-09-26 NOTE — Telephone Encounter (Signed)
Records release request faxed to [facility] for pap smear

## 2020-09-26 NOTE — Assessment & Plan Note (Signed)
Continue Omeprazole as needed Will monitor

## 2020-09-26 NOTE — Assessment & Plan Note (Signed)
Follows with GI yearly

## 2020-09-27 LAB — CBC
HCT: 39.9 % (ref 36.0–46.0)
Hemoglobin: 13.4 g/dL (ref 12.0–15.0)
MCHC: 33.6 g/dL (ref 30.0–36.0)
MCV: 78.9 fl (ref 78.0–100.0)
Platelets: 294 10*3/uL (ref 150.0–400.0)
RBC: 5.06 Mil/uL (ref 3.87–5.11)
RDW: 14.6 % (ref 11.5–15.5)
WBC: 7.7 10*3/uL (ref 4.0–10.5)

## 2020-09-27 LAB — COMPREHENSIVE METABOLIC PANEL
ALT: 9 U/L (ref 0–35)
AST: 12 U/L (ref 0–37)
Albumin: 3.9 g/dL (ref 3.5–5.2)
Alkaline Phosphatase: 62 U/L (ref 39–117)
BUN: 13 mg/dL (ref 6–23)
CO2: 27 mEq/L (ref 19–32)
Calcium: 8.9 mg/dL (ref 8.4–10.5)
Chloride: 104 mEq/L (ref 96–112)
Creatinine, Ser: 0.76 mg/dL (ref 0.40–1.20)
GFR: 104.86 mL/min (ref >60.00)
Glucose, Bld: 77 mg/dL (ref 70–99)
Potassium: 4.1 mEq/L (ref 3.5–5.1)
Sodium: 137 mEq/L (ref 135–145)
Total Bilirubin: 0.2 mg/dL (ref 0.2–1.2)
Total Protein: 6.8 g/dL (ref 6.0–8.3)

## 2020-09-27 LAB — LIPID PANEL
Cholesterol: 125 mg/dL (ref 0–200)
HDL: 35.8 mg/dL — ABNORMAL LOW (ref >39.00)
LDL Cholesterol: 64 mg/dL (ref 0–99)
NonHDL: 89.39
Total CHOL/HDL Ratio: 3
Triglycerides: 129 mg/dL (ref 0.0–149.0)
VLDL: 25.8 mg/dL (ref 0.0–40.0)

## 2020-09-28 ENCOUNTER — Other Ambulatory Visit: Payer: Self-pay | Admitting: Podiatry

## 2020-09-28 ENCOUNTER — Other Ambulatory Visit: Payer: Self-pay | Admitting: Internal Medicine

## 2020-09-28 DIAGNOSIS — M7671 Peroneal tendinitis, right leg: Secondary | ICD-10-CM

## 2020-10-19 ENCOUNTER — Ambulatory Visit: Payer: 59 | Admitting: Podiatry

## 2020-10-19 ENCOUNTER — Other Ambulatory Visit: Payer: Self-pay

## 2020-10-19 ENCOUNTER — Encounter: Payer: Self-pay | Admitting: Podiatry

## 2020-10-19 DIAGNOSIS — Q666 Other congenital valgus deformities of feet: Secondary | ICD-10-CM

## 2020-10-19 DIAGNOSIS — M7671 Peroneal tendinitis, right leg: Secondary | ICD-10-CM | POA: Diagnosis not present

## 2020-10-20 ENCOUNTER — Encounter: Payer: Self-pay | Admitting: Podiatry

## 2020-10-20 NOTE — Progress Notes (Signed)
Subjective:  Patient ID: Stephanie Brandt, female    DOB: April 04, 1991,  MRN: [id number]  Chief Complaint  Patient presents with  . Foot Pain    "its doing much better.  When I flex my foot I can feel a slight pull on the tendon"    29 y.o. female presents with the above complaint.  Patient presents with a follow-up of right peroneal tendon nidus.  Patient states she is doing a lot better.  She does not have any pain.  She has been able to ambulate out of the boot for a little bit as well.  She would like to know if she can start transition to regular shoes.  She denies any other acute complaints.   Review of Systems: Negative except as noted in the HPI. Denies N/V/F/Ch.  Past Medical History:  Diagnosis Date  . Anxiety   . Lynch syndrome    Risk for ovarian, uterine, & colon cancer. Yearly colonscopies with Dr[facility] Rogers Blocker at [facility]  . No pertinent past medical history     Current Outpatient Medications:  .  clonazePAM (KLONOPIN) 1 MG tablet, Take 1 tablet (1 mg total) by mouth daily as needed for anxiety., Disp: 30 tablet, Rfl: 0 .  FLUoxetine (PROZAC) 20 MG tablet, Take 1 tablet (20 mg total) by mouth daily., Disp: 90 tablet, Rfl: 3 .  ibuprofen (ADVIL) 200 MG tablet, Take by mouth., Disp: , Rfl:  .  norethindrone-ethinyl estradiol (LOESTRIN FE) 1-20 MG-MCG tablet, Take 1 tablet by mouth daily., Disp: , Rfl:  .  omeprazole (PRILOSEC) 20 MG capsule, Take 1 capsule (20 mg total) by mouth daily., Disp: 30 capsule, Rfl: 5  Social History   Tobacco Use  Smoking Status Former Smoker  . Types: Cigarettes  Smokeless Tobacco Never Used  Tobacco Comment   quit 2018    Allergies  Allergen Reactions  . Latex Hives  . Hydrocodone Hives   Objective:  There were no vitals filed for this visit. There is no height or weight on file to calculate BMI. Constitutional Well developed. Well nourished.  Vascular Dorsalis pedis pulses palpable bilaterally. Posterior tibial pulses palpable  bilaterally. Capillary refill normal to all digits.  No cyanosis or clubbing noted. Pedal hair growth normal.  Neurologic Normal speech. Oriented to person, place, and time. Epicritic sensation to light touch grossly present bilaterally.  Dermatologic Nails well groomed and normal in appearance. No open wounds. No skin lesions.  Orthopedic:  Now pain on palpation along the course of the peroneal tendon including its insertion into the fifth metatarsal base.  No pain with dorsiflexion eversion resisted active and passive.  No pain with plantarflexion inversion of the foot.  No pain at the Achilles tendon, posterior tibial tendon, ATFL ligament.   Radiographs: 3 views of skeletally mature adult right foot: No osseous abnormalities noted.  No stress fracture noted.  No ossicles identified.  Mild posterior heel spurring noted. Assessment:   No diagnosis found. Plan:  Patient was evaluated and treated and all questions answered.  Right peroneal tendinitis -Clinically completely resolved.  The cam boot immobilization no longer causes any pain.  At this time I discussed with her the importance of wearing shoe gear modification.  She states that she would do that.  I also believe she will benefit from orthotics as well.  Pes planovalgus flexible -I explained to the patient the etiology of pes planovalgus and various treatment options were discussed.  I believe patient will benefit from custom-made orthotics to  help control the hindfoot motion and support the arch of the foot take the stress of the peroneal tendon.  She states understanding like to get orthotics. -She was scheduled see rec for custom-made orthotics No follow-ups on file.

## 2020-11-04 ENCOUNTER — Encounter: Payer: Self-pay | Admitting: Internal Medicine

## 2020-11-07 ENCOUNTER — Telehealth: Payer: Self-pay | Admitting: *Deleted

## 2020-11-07 MED ORDER — FLUOXETINE HCL 20 MG PO TABS: 20.0000 mg | ORAL_TABLET | Freq: Every day | ORAL | 2 refills | Status: DC

## 2020-11-07 NOTE — Telephone Encounter (Signed)
Pharmacist called from [facility] stating that patient told her that a script was sent in to them for Fluoxetine 20 mg., but they never received it. Advised Pharmacist that our records show that they received the script on 09/26/20 #90/3. Pharmacist took the script over the phone and stated that she has no idea what happened.

## 2020-11-07 NOTE — Telephone Encounter (Signed)
I have rsent Rx with note to discontinue previous 10mg  Rx

## 2020-11-07 NOTE — Telephone Encounter (Signed)
The pt had already sent email... Rx had been sent over again

## 2020-11-07 NOTE — Addendum Note (Signed)
Addended by: Lurlean Nanny on: 11/07/2020 12:52 PM   Modules accepted: Orders

## 2021-01-09 ENCOUNTER — Encounter: Payer: Self-pay | Admitting: Internal Medicine

## 2021-01-10 ENCOUNTER — Other Ambulatory Visit: Payer: Self-pay

## 2021-01-10 ENCOUNTER — Ambulatory Visit: Payer: 59 | Admitting: Internal Medicine

## 2021-01-10 VITALS — BP 122/72 | HR 81 | Temp 97.8°F | Wt 170.0 lb

## 2021-01-10 DIAGNOSIS — A630 Anogenital (venereal) warts: Secondary | ICD-10-CM | POA: Diagnosis not present

## 2021-01-10 DIAGNOSIS — L738 Other specified follicular disorders: Secondary | ICD-10-CM

## 2021-01-10 DIAGNOSIS — R21 Rash and other nonspecific skin eruption: Secondary | ICD-10-CM

## 2021-01-10 MED ORDER — IMIQUIMOD 5 % EX CREA: TOPICAL_CREAM | CUTANEOUS | 0 refills | Status: DC

## 2021-01-10 MED ORDER — TRIAMCINOLONE ACETONIDE 0.1 % EX CREA: 1.0000 "application " | TOPICAL_CREAM | Freq: Two times a day (BID) | CUTANEOUS | 0 refills | Status: DC

## 2021-01-10 MED ORDER — METHYLPREDNISOLONE ACETATE 80 MG/ML IJ SUSP
80.0000 mg | Freq: Once | INTRAMUSCULAR | Status: AC
  Administered 2021-01-10: 80 mg via INTRAMUSCULAR

## 2021-01-10 MED ORDER — MUPIROCIN CALCIUM 2 % EX CREA: 1.0000 "application " | TOPICAL_CREAM | Freq: Two times a day (BID) | CUTANEOUS | 0 refills | Status: DC

## 2021-01-10 NOTE — Progress Notes (Signed)
Subjective:    Patient ID: Stephanie Brandt, female    DOB: Dec 12, 1990, 30 y.o.   MRN: [id number]  HPI  Patient presents the clinic today with complaint of a rash to her right leg, right buttock and right lower back.  She noticed this after getting in a hot tub while on vacation.  She reports the rash is very itchy.  It has not seemed to spread.  She has tried OTC steroid cream with minimal relief of symptoms.  She also reports lesion around her anus.  This has been there for a few months, she does feel like it is getting smaller in size.  She has not tried anything OTC for this.  She does report history of her husband having genital warts.  Review of Systems      Past Medical History:  Diagnosis Date  . Anxiety   . Lynch syndrome    Risk for ovarian, uterine, & colon cancer. Yearly colonscopies with Dr. Artis Flock at [facility]  . No pertinent past medical history     Current Outpatient Medications  Medication Sig Dispense Refill  . clonazePAM (KLONOPIN) 1 MG tablet Take 1 tablet (1 mg total) by mouth daily as needed for anxiety. 30 tablet 0  . FLUoxetine (PROZAC) 20 MG tablet Take 1 tablet (20 mg total) by mouth daily. 90 tablet 2  . ibuprofen (ADVIL) 200 MG tablet Take by mouth.    . norethindrone-ethinyl estradiol (LOESTRIN FE) 1-20 MG-MCG tablet Take 1 tablet by mouth daily.    Marland Kitchen omeprazole (PRILOSEC) 20 MG capsule Take 1 capsule (20 mg total) by mouth daily. 30 capsule 5   No current facility-administered medications for this visit.    Allergies  Allergen Reactions  . Latex Hives  . Hydrocodone Hives    Family History  Problem Relation Age of Onset  . Cancer Father   . Hypertension Mother     Social History   Socioeconomic History  . Marital status: Divorced    Spouse name: Not on file  . Number of children: Not on file  . Years of education: Not on file  . Highest education level: Not on file  Occupational History  . Not on file  Tobacco Use  . Smoking status:  Former Smoker    Types: Cigarettes  . Smokeless tobacco: Never Used  . Tobacco comment: quit 2018  Vaping Use  . Vaping Use: Never used  Substance and Sexual Activity  . Alcohol use: Yes  . Drug use: No  . Sexual activity: Yes    Birth control/protection: None, Pill  Other Topics Concern  . Not on file  Social History Narrative  . Not on file   Social Determinants of Health   Financial Resource Strain: Not on file  Food Insecurity: Not on file  Transportation Needs: Not on file  Physical Activity: Not on file  Stress: Not on file  Social Connections: Not on file  Intimate Partner Violence: Not on file     Constitutional: Denies fever, malaise, fatigue, headache or abrupt weight changes.  Respiratory: Denies difficulty breathing, shortness of breath, cough or sputum production.   Cardiovascular: Denies chest pain, chest tightness, palpitations or swelling in the hands or feet.  Skin: Patient reports rash, anal lesion.  Denies ulcercations.   No other specific complaints in a complete review of systems (except as listed in HPI above).  Objective:   Physical Exam  BP 122/72   Pulse 81   Temp 97.8 F (36.6  C) (Temporal)   Wt 170 lb (77.1 kg)   SpO2 97%   BMI 27.44 kg/m    Wt Readings from Last 3 Encounters:  09/26/20 165 lb (74.8 kg)  07/14/19 166 lb (75.3 kg)  08/13/17 154 lb 8 oz (70.1 kg)    General: Appears her stated age, well developed, well nourished in NAD. Skin: Scattered, scabbed, papular lesions on erythematous base noted of right posterior calf, posterior thigh and right lower back.  0.25 cm anal wart noted at 2 oclock Cardiovascular: Normal rate. Pulmonary/Chest: Normal effort. Neurological: Alert and oriented.   BMET    Component Value Date/Time   NA 137 09/26/2020 1456   NA 138 06/02/2017 1514   NA 138 03/29/2014 0638   K 4.1 09/26/2020 1456   K 3.7 03/29/2014 0638   CL 104 09/26/2020 1456   CL 107 03/29/2014 0638   CO2 27 09/26/2020  1456   CO2 26 03/29/2014 0638   GLUCOSE 77 09/26/2020 1456   GLUCOSE 108 (H) 03/29/2014 0638   BUN 13 09/26/2020 1456   BUN 11 06/02/2017 1514   BUN 16 03/29/2014 0638   CREATININE 0.76 09/26/2020 1456   CREATININE 0.72 03/29/2014 0638   CALCIUM 8.9 09/26/2020 1456   CALCIUM 8.8 03/29/2014 0638   GFRNONAA 127 06/02/2017 1514   GFRNONAA >60 03/29/2014 0638   GFRAA 147 06/02/2017 1514   GFRAA >60 03/29/2014 0638    Lipid Panel     Component Value Date/Time   CHOL 125 09/26/2020 1456   CHOL 113 06/02/2017 1514   TRIG 129.0 09/26/2020 1456   HDL 35.80 (L) 09/26/2020 1456   HDL 32 (L) 06/02/2017 1514   CHOLHDL 3 09/26/2020 1456   VLDL 25.8 09/26/2020 1456   LDLCALC 64 09/26/2020 1456   LDLCALC 57 06/02/2017 1514    CBC    Component Value Date/Time   WBC 7.7 09/26/2020 1456   RBC 5.06 09/26/2020 1456   HGB 13.4 09/26/2020 1456   HGB 13.6 06/02/2017 1514   HCT 39.9 09/26/2020 1456   HCT 41.2 06/02/2017 1514   PLT 294.0 09/26/2020 1456   PLT 308 06/02/2017 1514   MCV 78.9 09/26/2020 1456   MCV 84 06/02/2017 1514   MCV 80 03/29/2014 0638   MCH 27.6 06/02/2017 1514   MCH 28.4 04/04/2017 1741   MCHC 33.6 09/26/2020 1456   RDW 14.6 09/26/2020 1456   RDW 13.3 06/02/2017 1514   RDW 15.6 (H) 03/29/2014 0638   LYMPHSABS 2.3 04/04/2017 1741   LYMPHSABS 0.6 (L) 03/29/2014 0638   MONOABS 0.7 04/04/2017 1741   MONOABS 0.7 03/29/2014 0638   EOSABS 0.1 04/04/2017 1741   EOSABS 0.1 03/29/2014 0638   BASOSABS 0.1 04/04/2017 1741   BASOSABS 0.0 03/29/2014 0638    Hgb A1C No results found for: HGBA1C          Assessment & Plan:   Hot Tub Folliculitis:  Rx for Triamcinolone cream 0.1%, mixed with Mupirocin-apply to affected areas twice daily 80 mg Depo Medrol IM today  Anal Wart:  Rx for imiquimod cream 3 times weekly for no more than 8 weeks If symptoms do not improve consider follow-up with dermatology  Return precautions discussed  Nicki Reaper, NP This  visit occurred during the SARS-CoV-2 public health emergency.  Safety protocols were in place, including screening questions prior to the visit, additional usage of staff PPE, and extensive cleaning of exam room while observing appropriate contact time as indicated for disinfecting solutions.

## 2021-01-11 ENCOUNTER — Encounter: Payer: Self-pay | Admitting: Internal Medicine

## 2021-01-11 NOTE — Patient Instructions (Signed)
Genital Warts  Genital warts are a common STI (sexually transmitted infection). They are small warts in the area around the genitals or the opening of the butt (anus). They sometimes cause pain. Genital warts are easily passed to other people through sex. Many people do not know that they are infected. They may be infected for years without symptoms. Even without symptoms, they can pass the infection to their sex partners. What are the causes? Genital warts are caused by a virus called HPV. HPV spreads from person to person during sex. It can be spread through vaginal, anal, and oral sex. What increases the risk?  Having sex without using a condom.  Having sex with many people.  Having sex before the age of 16.  Being a female who is not circumcised.  Having a female sex partner who is not circumcised.  Having a weak body defense system (immune system). What are the signs or symptoms?  Small warts in the area around the genitals or the opening of the butt. These warts often grow in clusters.  Itching and irritation in the genital area or the area around the opening of the butt.  Bleeding from the warts.  Pain during sex. How is this treated?  Medicines, such as creams, that are put on the skin.  Procedures, such as: ? Freezing the warts. ? Burning the warts. ? Having surgery to get rid of the warts. Getting treatment is important because genital warts can lead to other problems. In females, the virus that causes the warts may increase the risk for cancer of the cervix. Follow these instructions at home: Medicines  Apply over-the-counter and prescription medicines only as told by your doctor.  Do not use medicines that are meant for treating hand warts.  Talk with your doctor about using creams to treat itching.   Instructions for women  Get regular tests to check for cancer of the cervix. This type of cancer grows slowly and can almost always be cured if it is found  early.  If you become pregnant, tell your doctor that you have had genital warts. General instructions  Do not touch or scratch the warts.  Do not have sex until your treatment is done.  Tell your current and past sex partners about your condition. They may need treatment.  After treatment, use condoms during sex.  Keep all follow-up visits as told by your doctor. This is important. How is this prevented? Talk with your doctor about getting the HPV shot. The HPV shot:  Can help stop some HPV infections and cancers.  Is given to males and females who are 11-26 years old.  Is not recommended for pregnant women.  Will not work if you already have HPV. Contact a doctor if:  You have redness, swelling, or pain in the area of the treated skin.  You have a fever.  You feel sick.  You have lumps or have bleeding in the area around your genitals or the opening of your butt.  You have pain during sex.  You have bleeding after sex. Summary  Genital warts are a common STI. They are small warts in the area around the genitals or the opening of the butt (anus).  A virus called HPV causes genital warts.  The virus is spread by having sex without using a condom.  Getting treatment is important.  This condition is treated using medicines. Freezing, burning, or surgery may be done to get rid of the warts. This information   is not intended to replace advice given to you by your health care provider. Make sure you discuss any questions you have with your health care provider. Document Revised: 10/07/2019 Document Reviewed: 10/07/2019 Elsevier Patient Education  2021 Elsevier Inc.  

## 2021-03-20 ENCOUNTER — Ambulatory Visit: Payer: 59 | Admitting: Podiatry

## 2021-03-20 ENCOUNTER — Encounter: Payer: Self-pay | Admitting: Podiatry

## 2021-03-20 ENCOUNTER — Other Ambulatory Visit: Payer: Self-pay

## 2021-03-20 DIAGNOSIS — L600 Ingrowing nail: Secondary | ICD-10-CM

## 2021-03-20 DIAGNOSIS — L603 Nail dystrophy: Secondary | ICD-10-CM

## 2021-03-20 NOTE — Patient Instructions (Signed)

## 2021-03-22 ENCOUNTER — Encounter: Payer: Self-pay | Admitting: Podiatry

## 2021-03-22 NOTE — Progress Notes (Signed)
Subjective:  Patient ID: Stephanie Brandt, female    DOB: 1991/11/12,  MRN: [id number]  Chief Complaint  Patient presents with  . Ingrown Toenail  . Ankle Pain    Patient presents today for ingrown toenail right hallux medial border off and on x 1 year and left ankle discomfort    30 y.o. female presents with the above complaint.  Patient presents with complaint of right hallux medial border ingrown.  Patient states been going on and off for years progressive gotten worse.  She would like to have it removed.  Is tender to touch.  She states has been going on for quite some time and has progressed to getting worse.  She has not had a removed in the past she would like to discuss treatment options to have removed.  She denies any purulent drainage.   Review of Systems: Negative except as noted in the HPI. Denies N/V/F/Ch.  Past Medical History:  Diagnosis Date  . Anxiety   . Lynch syndrome    Risk for ovarian, uterine, & colon cancer. Yearly colonscopies with Dr. Rogers Blocker at [facility]  . No pertinent past medical history     Current Outpatient Medications:  .  clonazePAM (KLONOPIN) 1 MG tablet, Take 1 tablet (1 mg total) by mouth daily as needed for anxiety., Disp: 30 tablet, Rfl: 0 .  FLUoxetine (PROZAC) 20 MG tablet, Take 1 tablet (20 mg total) by mouth daily., Disp: 90 tablet, Rfl: 2 .  ibuprofen (ADVIL) 200 MG tablet, Take by mouth., Disp: , Rfl:  .  imiquimod (ALDARA) 5 % cream, Apply topically 3 (three) times a week., Disp: 24 each, Rfl: 0 .  mupirocin cream (BACTROBAN) 2 %, Apply 1 application topically 2 (two) times daily., Disp: 15 g, Rfl: 0 .  norethindrone-ethinyl estradiol (LOESTRIN FE) 1-20 MG-MCG tablet, Take 1 tablet by mouth daily., Disp: , Rfl:  .  triamcinolone (KENALOG) 0.1 %, Apply 1 application topically 2 (two) times daily., Disp: 30 g, Rfl: 0  Social History   Tobacco Use  Smoking Status Former Smoker  . Types: Cigarettes  Smokeless Tobacco Never Used  Tobacco  Comment   quit 2018    Allergies  Allergen Reactions  . Latex Hives  . Hydrocodone Hives   Objective:  There were no vitals filed for this visit. There is no height or weight on file to calculate BMI. Constitutional Well developed. Well nourished.  Vascular Dorsalis pedis pulses palpable bilaterally. Posterior tibial pulses palpable bilaterally. Capillary refill normal to all digits.  No cyanosis or clubbing noted. Pedal hair growth normal.  Neurologic Normal speech. Oriented to person, place, and time. Epicritic sensation to light touch grossly present bilaterally.  Dermatologic Painful ingrowing nail at lateral nail borders of the hallux nail right. No other open wounds. No skin lesions.  Orthopedic: Normal joint ROM without pain or crepitus bilaterally. No visible deformities. No bony tenderness.   Radiographs: None Assessment:   1. Ingrown toenail of right foot   2. Nail dystrophy    Plan:  Patient was evaluated and treated and all questions answered.  Ingrown Nail, right with underlying nail dystrophy -Patient elects to proceed with minor surgery to remove ingrown toenail removal today. Consent reviewed and signed by patient. -Ingrown nail excised. See procedure note. -Educated on post-procedure care including soaking. Written instructions provided and reviewed. -Patient to follow up in 2 weeks for nail check.  Procedure: Excision of Ingrown Toenail Location: Right 1st toe lateral nail borders. Anesthesia: Lidocaine 1%  plain; 1.5 mL and Marcaine 0.5% plain; 1.5 mL, digital block. Skin Prep: Betadine. Dressing: Silvadene; telfa; dry, sterile, compression dressing. Technique: Following skin prep, the toe was exsanguinated and a tourniquet was secured at the base of the toe. The affected nail border was freed, split with a nail splitter, and excised. Chemical matrixectomy was then performed with phenol and irrigated out with alcohol. The tourniquet was then removed  and sterile dressing applied. Disposition: Patient tolerated procedure well. Patient to return in 2 weeks for follow-up.   No follow-ups on file.

## 2021-04-20 ENCOUNTER — Ambulatory Visit: Payer: 59 | Admitting: Podiatry

## 2021-04-20 ENCOUNTER — Other Ambulatory Visit: Payer: Self-pay | Admitting: Podiatry

## 2021-04-20 ENCOUNTER — Other Ambulatory Visit: Payer: Self-pay

## 2021-04-20 ENCOUNTER — Ambulatory Visit (INDEPENDENT_AMBULATORY_CARE_PROVIDER_SITE_OTHER): Payer: 59

## 2021-04-20 DIAGNOSIS — M79672 Pain in left foot: Secondary | ICD-10-CM | POA: Diagnosis not present

## 2021-04-20 DIAGNOSIS — L03031 Cellulitis of right toe: Secondary | ICD-10-CM

## 2021-04-20 DIAGNOSIS — M7752 Other enthesopathy of left foot: Secondary | ICD-10-CM

## 2021-04-20 DIAGNOSIS — M7750 Other enthesopathy of unspecified foot: Secondary | ICD-10-CM

## 2021-04-20 MED ORDER — DOXYCYCLINE HYCLATE 100 MG PO TABS: 100.0000 mg | ORAL_TABLET | Freq: Two times a day (BID) | ORAL | 0 refills | Status: AC

## 2021-04-24 ENCOUNTER — Encounter: Payer: Self-pay | Admitting: Podiatry

## 2021-04-24 NOTE — Progress Notes (Signed)
Subjective:  Patient ID: Stephanie Brandt, female    DOB: [birth year],  MRN: [id number]  Chief Complaint  Patient presents with  . Nail Problem    Right 1st toenail pus drainage  . Foot Pain    Left dorsal foot pain 3 day duration no known injuries    30 y.o. female presents with the above complaint.  Patient presents with complaint of right big toe redness.  She had a nail avulsion that was done by me last month.  She stated looked fine however all of a sudden to start becoming red.  She does not know if there is anything that she has done.  She has not taken any antibiotics.  She denies any other acute complaints.  She would like to discuss treatment options for this.  She wants to make sure that the ingrown has not grown back.   Review of Systems: Negative except as noted in the HPI. Denies N/V/F/Ch.  Past Medical History:  Diagnosis Date  . Anxiety   . Lynch syndrome    Risk for ovarian, uterine, & colon cancer. Yearly colonscopies with Dr. Rogers Blocker at [facility]  . No pertinent past medical history     Current Outpatient Medications:  .  doxycycline (VIBRA-TABS) 100 MG tablet, Take 1 tablet (100 mg total) by mouth 2 (two) times daily for 10 days., Disp: 20 tablet, Rfl: 0 .  FLUoxetine (PROZAC) 20 MG capsule, fluoxetine 20 mg capsule, Disp: , Rfl:  .  FLUoxetine (PROZAC) 20 MG tablet, Take 1 tablet (20 mg total) by mouth daily., Disp: 90 tablet, Rfl: 2 .  ibuprofen (ADVIL) 200 MG tablet, Take by mouth., Disp: , Rfl:  .  imiquimod (ALDARA) 5 % cream, Apply topically 3 (three) times a week., Disp: 24 each, Rfl: 0 .  mupirocin cream (BACTROBAN) 2 %, Apply 1 application topically 2 (two) times daily., Disp: 15 g, Rfl: 0 .  norethindrone-ethinyl estradiol (LOESTRIN FE) 1-20 MG-MCG tablet, Take 1 tablet by mouth daily., Disp: , Rfl:  .  triamcinolone (KENALOG) 0.1 %, Apply 1 application topically 2 (two) times daily., Disp: 30 g, Rfl: 0 .  VELIVET 0.1/0.125/0.15 -0.025 MG tablet, Take 1 tablet  by mouth daily., Disp: , Rfl:  .  clonazePAM (KLONOPIN) 1 MG tablet, Take 1 tablet (1 mg total) by mouth daily as needed for anxiety., Disp: 30 tablet, Rfl: 0  Social History   Tobacco Use  Smoking Status Former Smoker  . Types: Cigarettes  Smokeless Tobacco Never Used  Tobacco Comment   quit 2018    Allergies  Allergen Reactions  . Latex Hives  . Hydrocodone Hives   Objective:  There were no vitals filed for this visit. There is no height or weight on file to calculate BMI. Constitutional Well developed. Well nourished.  Vascular Dorsalis pedis pulses palpable bilaterally. Posterior tibial pulses palpable bilaterally. Capillary refill normal to all digits.  No cyanosis or clubbing noted. Pedal hair growth normal.  Neurologic Normal speech. Oriented to person, place, and time. Epicritic sensation to light touch grossly present bilaterally.  Dermatologic  mild erythema noted around the right hallux medial border.  Mild pain on palpation.  No purulent drainage was expressed.  No signs of recurrent ingrown noted.  Orthopedic: Normal joint ROM without pain or crepitus bilaterally. No visible deformities. No bony tenderness.   Radiographs: None Assessment:   1. Paronychia of great toe of right foot   2. Pain in left foot    Plan:  Patient was  evaluated and treated and all questions answered.  Right paronychia of great toe secondary to ingrown -I explained the patient the etiology of paronychia and various treatment options were discussed.  Given that she has not taken antibiotics she may benefit from 10 days of doxycycline.  Patient states understanding like to proceed with that. -Doxycycline was dispensed to pharmacy for skin and soft tissue prophylaxis -If there is no improvement then it might likely be a chemical reaction as well.  However we will continue to monitor for improvement.  I do not see any signs of recurrent ingrown's at this time.   No follow-ups on  file.

## 2021-05-03 ENCOUNTER — Other Ambulatory Visit: Payer: Self-pay | Admitting: Internal Medicine

## 2021-05-03 NOTE — Telephone Encounter (Signed)
   Notes to clinic Not a provider we approve rx for, no upcoming appt with new practice.  

## 2021-07-05 ENCOUNTER — Ambulatory Visit: Payer: 59 | Admitting: Nurse Practitioner

## 2021-07-05 ENCOUNTER — Other Ambulatory Visit: Payer: Self-pay

## 2021-07-05 ENCOUNTER — Encounter: Payer: Self-pay | Admitting: Nurse Practitioner

## 2021-07-05 ENCOUNTER — Telehealth: Payer: Self-pay

## 2021-07-05 VITALS — BP 120/76 | HR 66 | Temp 98.6°F | Resp 14 | Ht 66.0 in | Wt 172.8 lb

## 2021-07-05 DIAGNOSIS — T148XXA Other injury of unspecified body region, initial encounter: Secondary | ICD-10-CM | POA: Diagnosis not present

## 2021-07-05 MED ORDER — LEVOCETIRIZINE DIHYDROCHLORIDE 5 MG PO TABS
5.0000 mg | ORAL_TABLET | Freq: Every evening | ORAL | 0 refills | Status: DC
Start: 2021-07-05 — End: 2022-09-25
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
## 2021-07-05 NOTE — Assessment & Plan Note (Signed)
Patient was evaluated by video at earlier today and diagnosed with shingles.  The provider wrote her a Valtrex prescription that patient has not picked up yet.  She was concerned that it may not be shingles and wanted an in person assessment.  Patient states she does have a outdoor cat that comes and sometimes she is concerned for fleas or bedbugs.  The bite/spots do not resemble flea bites nor bedbugs. Will start second-generation antihistamine and OTC hydrocortisone cream for symptom management relief.  Signs and symptoms reviewed with patient as when to be reevaluated or seek emergent care.  Patient acknowledged.

## 2021-07-05 NOTE — Telephone Encounter (Signed)
Pt contacted office requesting apt and was told she was scheduled today but when scheduled she was scheduled for a week from today. Scheduled did not know until after pt hung up. At that time the apt slot for the pt was already filled. Contacted the pt and explained.    Pt contacted office c/o rash/bumps on neck for 2 days, stinging w/red center, one very swollen and clear drainage. Pt also reports R eyelid very swollen, bumps like hives, and red, this morning but gone now. Pt reports bumps on neck a still sore to touch and that she had VV this morning and was told she had shingles and was prescribed an antiviral, she thinks, but is not sure. But pt is afraid to take the medication if the bumps are not shingles and she is requesting an OV for in person assessment. Pt also reports diarrhea and stomach cramps on 5 days ago but that she has a hx of GI problems and this only lasted 1.5 days and is not longer gong on. Pt denies any new medications, soaps, lotions, or hygeine products. She reports she was outside this weekend but not working in the yard so she does not think it is a rash from plant or insect. Pt reports hx of chicken pox when she was a child. Also reports her colleagues at work think she should be seen in the office today. Advised pt this visit would be only an acute visit and she could establish care with the provider at a later scheduled apt. Pt agreed to acute visit with new provider and set up TOC at later date. Pt will be here at 1120 for check in of apt.

## 2021-07-05 NOTE — Patient Instructions (Signed)
Keep an eye on the rash. Hold on to the valtrex prescription Will send in an antihistamine as discussed and use hydrocortisone OTC. Follow up if symptoms fail to improve or worsen.

## 2021-07-10 ENCOUNTER — Ambulatory Visit: Payer: 59 | Admitting: Primary Care

## 2021-07-16 ENCOUNTER — Other Ambulatory Visit: Payer: Self-pay | Admitting: Family

## 2021-08-20 ENCOUNTER — Telehealth: Payer: 59 | Admitting: Family Medicine

## 2021-08-20 ENCOUNTER — Other Ambulatory Visit: Payer: Self-pay

## 2021-08-20 ENCOUNTER — Encounter: Payer: Self-pay | Admitting: Family Medicine

## 2021-08-20 ENCOUNTER — Telehealth: Payer: Self-pay

## 2021-08-20 ENCOUNTER — Encounter: Payer: Self-pay | Admitting: Nurse Practitioner

## 2021-08-20 DIAGNOSIS — L989 Disorder of the skin and subcutaneous tissue, unspecified: Secondary | ICD-10-CM

## 2021-08-20 DIAGNOSIS — R21 Rash and other nonspecific skin eruption: Secondary | ICD-10-CM | POA: Diagnosis not present

## 2021-08-20 NOTE — Telephone Encounter (Signed)
It appears that the patient had a virtual appt with Cherly Beach, NP and was already swabbed to rule out the monkeypox.
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
## 2021-08-20 NOTE — Progress Notes (Signed)
Pt parked outside of facility and stayed in vehicle. Observed three small broken areas in skin on right 5th finger, right middle finger, and on right palm. Areas were scabbed over. Was able to unroof area on palm and collect small amount of serosanguinous fluid on swab. Swab stored in tube with viral transport media. Pt instructed to isolate until results come back. Pt verbalized understanding and expressed gratitude for being able to have undergo testing today.

## 2021-08-20 NOTE — Patient Instructions (Addendum)
I appreciate the opportunity to provide you with care for your health and wellness.  Please self isolate until you have seen the health dept and they have ruled out anything contagious.  Please continue to practice social distancing to keep you, your family, and our community safe.  If you must go out, please wear a mask and practice good handwashing.  Work note provided I hope you feel better soon!  Have a wonderful day. With Gratitude, Cherly Beach, DNP, AGNP-BC

## 2021-08-20 NOTE — Telephone Encounter (Signed)
Copied from [place] [phone]. Topic: Appointment Scheduling - Scheduling Inquiry for Clinic >> Aug 20, 2021 11:14 AM Pawlus, Brayton Layman A wrote: Reason for CRM: Pt wanted to know if she could be worked in since she just had a virtual visit but needs to be seen in person to rule out Monkeypox, please advise if Rollene Fare can see the pt anytime this week.  Pt needs this done for work.

## 2021-08-21 ENCOUNTER — Ambulatory Visit: Payer: 59 | Admitting: Nurse Practitioner

## 2021-08-22 ENCOUNTER — Encounter: Payer: Self-pay | Admitting: Nurse Practitioner

## 2021-08-22 ENCOUNTER — Ambulatory Visit: Payer: 59 | Admitting: Nurse Practitioner

## 2021-08-22 ENCOUNTER — Other Ambulatory Visit: Payer: Self-pay | Admitting: Nurse Practitioner

## 2021-08-22 DIAGNOSIS — F411 Generalized anxiety disorder: Secondary | ICD-10-CM

## 2021-08-22 LAB — MONKEYPOX VIRUS DNA, QUALITATIVE REAL-TIME PCR
MONKEYPOX VIRUS DNA, QL PCR: NOT DETECTED
Orthopoxvirus DNA, QL PCR: NOT DETECTED

## 2021-08-22 MED ORDER — FLUOXETINE HCL 20 MG PO CAPS: ORAL_CAPSULE | ORAL | 1 refills | Status: DC

## 2021-08-22 NOTE — Progress Notes (Signed)
Refilling prozac and asked patient to schedule a TOC visit with me

## 2021-10-16 ENCOUNTER — Encounter: Payer: Self-pay | Admitting: Internal Medicine

## 2021-10-16 DIAGNOSIS — F411 Generalized anxiety disorder: Secondary | ICD-10-CM

## 2021-10-17 MED ORDER — FLUOXETINE HCL 20 MG PO CAPS: ORAL_CAPSULE | ORAL | 0 refills | Status: DC

## 2021-12-11 ENCOUNTER — Ambulatory Visit: Payer: 59 | Admitting: Internal Medicine

## 2021-12-11 NOTE — Progress Notes (Deleted)
Subjective:    Patient ID: Stephanie Brandt, female    DOB: 01-15-1991, 31 y.o.   MRN: [id number]  HPI  Patient presents the clinic today for follow-up of chronic conditions.  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  She follows with GI.  Anxiety: Chronic, managed on fluoxetine.  She takes clonazepam as needed.  She is not currently seeing a therapist.  She denies depression, SI/HI.  GERD: Currently not an issue.  She takes Omeprazole as needed with good relief of symptoms.  Upper GI from 02/2008 reviewed.  Review of Systems     Past Medical History:  Diagnosis Date   Anxiety    Lynch syndrome    Risk for ovarian, uterine, & colon cancer. Yearly colonscopies with Dr. Rogers Blocker at [facility]   No pertinent past medical history     Current Outpatient Medications  Medication Sig Dispense Refill   clonazePAM (KLONOPIN) 1 MG tablet Take 1 tablet (1 mg total) by mouth daily as needed for anxiety. 30 tablet 0   FLUoxetine (PROZAC) 20 MG capsule TAKE 1 CAPSULE BY MOUTH EVERY DAY 90 capsule 0   ibuprofen (ADVIL) 200 MG tablet Take by mouth.     imiquimod (ALDARA) 5 % cream Apply topically 3 (three) times a week. 24 each 0   levocetirizine (XYZAL) 5 MG tablet Take 1 tablet (5 mg total) by mouth every evening. 30 tablet 0   valACYclovir (VALTREX) 1000 MG tablet Take 1,000 mg by mouth 3 (three) times daily. (Patient not taking: Reported on 07/05/2021)     VELIVET 0.1/0.125/0.15 -0.025 MG tablet Take 1 tablet by mouth daily.     No current facility-administered medications for this visit.    Allergies  Allergen Reactions   Latex Hives   Hydrocodone Hives    Family History  Problem Relation Age of Onset   Cancer Father    Hypertension Mother     Social History   Socioeconomic History   Marital status: Single    Spouse name: Not on file   Number of children: Not on file   Years of education: Not on file   Highest education level: Not on file  Occupational History   Not on  file  Tobacco Use   Smoking status: Former    Types: Cigarettes   Smokeless tobacco: Never   Tobacco comments:    quit 2018  Vaping Use   Vaping Use: Never used  Substance and Sexual Activity   Alcohol use: Yes   Drug use: No   Sexual activity: Yes    Birth control/protection: None, Pill  Other Topics Concern   Not on file  Social History Narrative   Not on file   Social Determinants of Health   Financial Resource Strain: Not on file  Food Insecurity: Not on file  Transportation Needs: Not on file  Physical Activity: Not on file  Stress: Not on file  Social Connections: Not on file  Intimate Partner Violence: Not on file     Constitutional: Denies fever, malaise, fatigue, headache or abrupt weight changes.  HEENT: Denies eye pain, eye redness, ear pain, ringing in the ears, wax buildup, runny nose, nasal congestion, bloody nose, or sore throat. Respiratory: Denies difficulty breathing, shortness of breath, cough or sputum production.   Cardiovascular: Denies chest pain, chest tightness, palpitations or swelling in the hands or feet.  Gastrointestinal: Denies abdominal pain, bloating, constipation, diarrhea or blood in the stool.  GU: Denies urgency, frequency, pain with urination, burning  sensation, blood in urine, odor or discharge. Musculoskeletal: Denies decrease in range of motion, difficulty with gait, muscle pain or joint pain and swelling.  Skin: Denies redness, rashes, lesions or ulcercations.  Neurological: Denies dizziness, difficulty with memory, difficulty with speech or problems with balance and coordination.  Psych: Patient has a history of anxiety.  Denies depression, SI/HI.  No other specific complaints in a complete review of systems (except as listed in HPI above).  Objective:   Physical Exam   There were no vitals taken for this visit. Wt Readings from Last 3 Encounters:  07/05/21 172 lb 12 oz (78.4 kg)  01/10/21 170 lb (77.1 kg)  09/26/20 165 lb  (74.8 kg)    General: Appears their stated age, well developed, well nourished in NAD. Skin: Warm, dry and intact. No rashes, lesions or ulcerations noted. HEENT: Head: normal shape and size; Eyes: sclera white, no icterus, conjunctiva pink, PERRLA and EOMs intact; Ears: Tm's gray and intact, normal light reflex; Nose: mucosa pink and moist, septum midline; Throat/Mouth: Teeth present, mucosa pink and moist, no exudate, lesions or ulcerations noted.  Neck:  Neck supple, trachea midline. No masses, lumps or thyromegaly present.  Cardiovascular: Normal rate and rhythm. S1,S2 noted.  No murmur, rubs or gallops noted. No JVD or BLE edema. No carotid bruits noted. Pulmonary/Chest: Normal effort and positive vesicular breath sounds. No respiratory distress. No wheezes, rales or ronchi noted.  Abdomen: Soft and nontender. Normal bowel sounds. No distention or masses noted. Liver, spleen and kidneys non palpable. Musculoskeletal: Normal range of motion. No signs of joint swelling. No difficulty with gait.  Neurological: Alert and oriented. Cranial nerves II-XII grossly intact. Coordination normal.  Psychiatric: Mood and affect normal. Behavior is normal. Judgment and thought content normal.    BMET    Component Value Date/Time   NA 137 09/26/2020 1456   NA 138 06/02/2017 1514   NA 138 03/29/2014 0638   K 4.1 09/26/2020 1456   K 3.7 03/29/2014 0638   CL 104 09/26/2020 1456   CL 107 03/29/2014 0638   CO2 27 09/26/2020 1456   CO2 26 03/29/2014 0638   GLUCOSE 77 09/26/2020 1456   GLUCOSE 108 (H) 03/29/2014 0638   BUN 13 09/26/2020 1456   BUN 11 06/02/2017 1514   BUN 16 03/29/2014 0638   CREATININE 0.76 09/26/2020 1456   CREATININE 0.72 03/29/2014 0638   CALCIUM 8.9 09/26/2020 1456   CALCIUM 8.8 03/29/2014 0638   GFRNONAA 127 06/02/2017 1514   GFRNONAA >60 03/29/2014 0638   GFRAA 147 06/02/2017 1514   GFRAA >60 03/29/2014 0638    Lipid Panel     Component Value Date/Time   CHOL 125  09/26/2020 1456   CHOL 113 06/02/2017 1514   TRIG 129.0 09/26/2020 1456   HDL 35.80 (L) 09/26/2020 1456   HDL 32 (L) 06/02/2017 1514   CHOLHDL 3 09/26/2020 1456   VLDL 25.8 09/26/2020 1456   LDLCALC 64 09/26/2020 1456   LDLCALC 57 06/02/2017 1514    CBC    Component Value Date/Time   WBC 7.7 09/26/2020 1456   RBC 5.06 09/26/2020 1456   HGB 13.4 09/26/2020 1456   HGB 13.6 06/02/2017 1514   HCT 39.9 09/26/2020 1456   HCT 41.2 06/02/2017 1514   PLT 294.0 09/26/2020 1456   PLT 308 06/02/2017 1514   MCV 78.9 09/26/2020 1456   MCV 84 06/02/2017 1514   MCV 80 03/29/2014 0638   MCH 27.6 06/02/2017 1514   MCH  28.4 04/04/2017 1741   MCHC 33.6 09/26/2020 1456   RDW 14.6 09/26/2020 1456   RDW 13.3 06/02/2017 1514   RDW 15.6 (H) 03/29/2014 0638   LYMPHSABS 2.3 04/04/2017 1741   LYMPHSABS 0.6 (L) 03/29/2014 0638   MONOABS 0.7 04/04/2017 1741   MONOABS 0.7 03/29/2014 0638   EOSABS 0.1 04/04/2017 1741   EOSABS 0.1 03/29/2014 0638   BASOSABS 0.1 04/04/2017 1741   BASOSABS 0.0 03/29/2014 0638    Hgb A1C No results found for: HGBA1C         Assessment & Plan:     Webb Silversmith, NP This visit occurred during the SARS-CoV-2 public health emergency.  Safety protocols were in place, including screening questions prior to the visit, additional usage of staff PPE, and extensive cleaning of exam room while observing appropriate contact time as indicated for disinfecting solutions.

## 2022-08-23 HISTORY — PX: COLONOSCOPY WITH PROPOFOL: SHX5780

## 2022-08-23 HISTORY — PX: COLONOSCOPY: SHX174

## 2022-08-23 LAB — HM COLONOSCOPY

## 2022-09-19 ENCOUNTER — Encounter (HOSPITAL_BASED_OUTPATIENT_CLINIC_OR_DEPARTMENT_OTHER): Payer: Self-pay | Admitting: Obstetrics and Gynecology

## 2022-09-25 ENCOUNTER — Encounter (HOSPITAL_BASED_OUTPATIENT_CLINIC_OR_DEPARTMENT_OTHER): Payer: Self-pay | Admitting: Obstetrics and Gynecology

## 2022-09-25 NOTE — Progress Notes (Signed)
Spoke w/ via phone for pre-op interview--- pt Lab needs dos---- urine preg              Lab results------ lab appt 10-01-2022 at 1345 CBC/ T&S COVID test -----patient states asymptomatic no test needed Arrive at ------- 0600 on 10-03-2022 NPO after MN NO Solid Food.  Clear liquids from MN until--- 0500 Med rec completed Medications to take morning of surgery ----- none Diabetic medication ----- n/a Patient instructed no nail polish to be worn day of surgery Patient instructed to bring photo id and insurance card day of surgery Patient aware to have Driver (ride ) / caregiver for 24 hours after surgery --- husband, Stephanie Brandt Patient Special Instructions ----- reviewed rcc and visitor guidelines,  printed instructions to pick up w/ hibiclens at lab appt Pre-Op special Istructions ----- n/a Patient verbalized understanding of instructions that were given at this phone interview. Patient denies shortness of breath, chest pain, fever, cough at this phone interview.

## 2022-09-25 NOTE — Progress Notes (Addendum)
Your procedure is scheduled on  Thrusday, 10-03-2022  Report to [place] AT  6:00 AM   Call this number if you have problems the morning of surgery  :[phone].   OUR ADDRESS IS [address].  WE ARE LOCATED IN [facility].  PLEASE BRING YOUR INSURANCE CARD AND PHOTO ID DAY OF SURGERY.  ONLY 2 PEOPLE ARE ALLOWED IN  WAITING  ROOM.                                      REMEMBER:  DO NOT EAT FOOD, CANDY GUM OR MINTS  AFTER MIDNIGHT THE NIGHT BEFORE YOUR SURGERY . YOU MAY HAVE CLEAR LIQUIDS FROM MIDNIGHT THE NIGHT BEFORE YOUR SURGERY UNTIL  5:00 AM _. NO CLEAR LIQUIDS AFTER   _ 5:00 AM  DAY OF SURGERY.  YOU MAY  BRUSH YOUR TEETH MORNING OF SURGERY AND RINSE YOUR MOUTH OUT, NO CHEWING GUM CANDY OR MINTS.     CLEAR LIQUID DIET Water  Coffee and tea, regular and  decaf        (no type cream or mild products , may sweeten)                                    Carbonated beverages, regular and diet                                    Sports drinks like Gatorade _____________________________________________________________________     TAKE ONLY THESE MEDICATIONS MORNING OF SURGERY:  none    UP TO 4 VISITORS  MAY VISIT IN THE EXTENDED RECOVERY ROOM UNTIL 800 PM ONLY.  ONE  VISITOR AGE 80 AND OVER MAY SPEND THE NIGHT AND MUST BE IN EXTENDED RECOVERY ROOM NO LATER THAN 800 PM . YOUR DISCHARGE TIME AFTER YOU SPEND THE NIGHT IS 900 AM THE MORNING AFTER YOUR SURGERY.  YOU MAY PACK A SMALL OVERNIGHT BAG WITH TOILETRIES FOR YOUR OVERNIGHT STAY IF YOU WISH.  YOUR PRESCRIPTION MEDICATIONS WILL BE PROVIDED DURING [place].                                      DO NOT WEAR JEWERLY, MAKE UP. DO NOT WEAR LOTIONS, POWDERS, PERFUMES OR NAIL POLISH ON YOUR FINGERNAILS. TOENAIL POLISH IS OK TO WEAR. DO NOT SHAVE FOR 48 HOURS PRIOR TO DAY OF SURGERY.  CONTACTS, GLASSES, OR DENTURES MAY NOT BE WORN TO SURGERY.  REMEMBER: NO SMOKING, DRUGS OR  ALCOHOL FOR 24 HOURS BEFORE YOUR SURGERY.                                    [facility] IS NOT RESPONSIBLE  FOR ANY BELONGINGS.                                                                    [facility]  [facility] - Preparing for Surgery Before surgery, you can play an important role.  Because skin is not sterile, your skin needs to be as free of germs as possible.  You can reduce the number of germs on your skin by washing with CHG (chlorahexidine gluconate) soap before surgery.  CHG is an antiseptic cleaner which kills germs and bonds with the skin to continue killing germs even after washing. Please DO NOT use if you have an allergy to CHG or antibacterial soaps.  If your skin becomes reddened/irritated stop using the CHG and inform your nurse when you arrive at Short Stay. Do not shave (including legs and underarms) for at least 48 hours prior to the first CHG shower.  You may shave your face/neck. Please follow these instructions carefully:  1.  Shower with CHG Soap the night before surgery and the  morning of Surgery.  2.  If you choose to wash your hair, wash your hair first as usual with your  normal  shampoo.  3.  After you shampoo, rinse your hair and body thoroughly to remove the  shampoo.                                        4.  Use CHG as you would any other liquid soap.  You can apply chg directly  to the skin and wash , chg soap provided, night before and morning of your surgery.  5.  Apply the CHG Soap to your body ONLY FROM THE NECK DOWN.   Do not use on face/ open                           Wound or open sores. Avoid contact with eyes, ears mouth and genitals (private parts).                       Wash face,  Genitals (private parts) with your normal soap.             6.  Wash thoroughly, paying special attention to the area where your surgery  will be performed.  7.  Thoroughly rinse your body with warm water from the neck down.  8.  DO NOT shower/wash with your normal  soap after using and rinsing off  the CHG Soap.             9.  Pat yourself dry with a clean towel.            10.  Wear clean pajamas.            11.  Place clean sheets on your bed the night of your first shower and do not  sleep with pets. Day of Surgery : Do not apply any lotions/deodorants the morning of surgery.  Please wear clean clothes to the hospital/surgery center.  IF YOU HAVE ANY SKIN IRRITATION OR PROBLEMS WITH THE SURGICAL SOAP, PLEASE GET A BAR OF GOLD DIAL SOAP AND SHOWER THE NIGHT BEFORE YOUR SURGERY AND THE MORNING OF YOUR SURGERY. PLEASE LET THE NURSE KNOW MORNING OF YOUR SURGERY IF YOU HAD ANY PROBLEMS WITH THE SURGICAL SOAP.   ________________________________________________________________________  QUESTIONS CALL [facility] NURSE PHONE [phone].   Or Langley Gauss [phone]

## 2022-10-01 ENCOUNTER — Encounter (HOSPITAL_COMMUNITY)
Admission: RE | Admit: 2022-10-01 | Discharge: 2022-10-01 | Disposition: A | Discharge status: Home / Self Care | Payer: 59 | Source: Ambulatory Visit | Attending: Obstetrics and Gynecology | Admitting: Obstetrics and Gynecology

## 2022-10-01 ENCOUNTER — Encounter (HOSPITAL_COMMUNITY): Payer: 59

## 2022-10-01 DIAGNOSIS — Z01812 Encounter for preprocedural laboratory examination: Secondary | ICD-10-CM | POA: Insufficient documentation

## 2022-10-01 DIAGNOSIS — Z1509 Genetic susceptibility to other malignant neoplasm: Secondary | ICD-10-CM | POA: Diagnosis not present

## 2022-10-01 LAB — CBC
HCT: 41.4 % (ref 36.0–46.0)
Hemoglobin: 13.5 g/dL (ref 12.0–15.0)
MCH: 27.6 pg (ref 26.0–34.0)
MCHC: 32.6 g/dL (ref 30.0–36.0)
MCV: 84.5 fL (ref 80.0–100.0)
Platelets: 297 10*3/uL (ref 150–400)
RBC: 4.9 MIL/uL (ref 3.87–5.11)
RDW: 12.8 % (ref 11.5–15.5)
WBC: 7.1 10*3/uL (ref 4.0–10.5)
nRBC: 0 % (ref 0.0–0.2)

## 2022-10-02 NOTE — H&P (Signed)
Stephanie Brandt is an 31 y.o. female. She has Lynch syndrome, has completed her childbearing, so presents for hysterectomy and BSO to reduce risk of cancer.  She was having regular menses monthly on OCP, but has stopped OCP, continues to have monthly menses, feels better off OCP.  Pertinent Gynecological History: Last pap: normal Date: 07/09/2022 OB History: G1, P1001 SVD   Menstrual History: Patient's last menstrual period was 09/24/2022 (exact date).    Past Medical History:  Diagnosis Date   Anxiety    Lynch syndrome    Risk for ovarian, uterine, & colon cancer. Yearly colonscopies with Dr. Rogers Blocker at [facility]   Wears glasses     Past Surgical History:  Procedure Laterality Date   COLONOSCOPY WITH PROPOFOL  08/23/2022   hx of yearly colonoscopies due to Lynch syndrome, most recent colonoscopy as of 09/19/22 is 08/23/22   ESOPHAGOGASTRODUODENOSCOPY     hx of multiple EGDs, 2007, 2019, 2022   NO PAST SURGERIES     WISDOM TOOTH EXTRACTION  08/09/2012    Family History  Problem Relation Age of Onset   Cancer Father    Hypertension Mother     Social History:  reports that she quit smoking about 7 years ago. Her smoking use included cigarettes. She has never used smokeless tobacco. She reports that she does not currently use alcohol. She reports that she does not use drugs.  Allergies:  Allergies  Allergen Reactions   Latex Hives   Hydrocodone Hives    No medications prior to admission.    Review of Systems  Respiratory: Negative.    Cardiovascular: Negative.     Height '5\' 6"'$  (1.676 m), weight 77.1 kg, last menstrual period 09/24/2022. Physical Exam Constitutional:      Appearance: Normal appearance.  Cardiovascular:     Rate and Rhythm: Normal rate and regular rhythm.     Heart sounds: Normal heart sounds. No murmur heard. Pulmonary:     Effort: Pulmonary effort is normal. No respiratory distress.     Breath sounds: Normal breath sounds. No wheezing.  Abdominal:      General: There is no distension.     Palpations: Abdomen is soft. There is no mass.     Tenderness: There is no abdominal tenderness.  Genitourinary:    General: Normal vulva.     Comments: Normal uterus No adnexal mass Musculoskeletal:     Cervical back: Normal range of motion and neck supple.  Neurological:     Mental Status: She is alert.     Results for orders placed or performed during the hospital encounter of 10/01/22 (from the past 24 hour(s))  Type and screen [facility]     Status: None   Collection Time: 10/01/22  2:44 PM  Result Value Ref Range   ABO/RH(D) O POS    Antibody Screen NEG    Sample Expiration 10/15/2022,2359    Extend sample reason      NO TRANSFUSIONS OR PREGNANCY IN THE PAST 3 MONTHS Performed at [facility] [address] [place], [place] [address]   CBC     Status: None   Collection Time: 10/01/22  2:44 PM  Result Value Ref Range   WBC 7.1 4.0 - 10.5 K/uL   RBC 4.90 3.87 - 5.11 MIL/uL   Hemoglobin 13.5 12.0 - 15.0 g/dL   HCT 41.4 36.0 - 46.0 %   MCV 84.5 80.0 - 100.0 fL   MCH 27.6 26.0 - 34.0 pg  MCHC 32.6 30.0 - 36.0 g/dL   RDW 12.8 11.5 - 15.5 %   Platelets 297 150 - 400 K/uL   nRBC 0.0 0.0 - 0.2 %    No results found.  Assessment/Plan: Lynch syndrome, finished childbearing.  Surgical procedure, risks, alternatives, reason for surgery have all been discussed, questions answered.  Will admit for TLH/BSO, cystoscopy  Blane Ohara Shameer Molstad 10/02/2022, 2:03 PM

## 2022-10-02 NOTE — Anesthesia Preprocedure Evaluation (Signed)
Anesthesia Evaluation  Patient identified by MRN, date of birth, ID band Patient awake    Reviewed: Allergy & Precautions, NPO status , Patient's Chart, lab work & pertinent test results  Airway Mallampati: I  TM Distance: >3 FB Neck ROM: Full    Dental no notable dental hx. (+) Teeth Intact, Dental Advisory Given   Pulmonary former smoker,    Pulmonary exam normal breath sounds clear to auscultation       Cardiovascular Exercise Tolerance: Good Normal cardiovascular exam Rhythm:Regular Rate:Normal     Neuro/Psych PSYCHIATRIC DISORDERS Anxiety    GI/Hepatic Neg liver ROS, GERD  ,  Endo/Other  negative endocrine ROS  Renal/GU negative Renal ROS     Musculoskeletal negative musculoskeletal ROS (+)   Abdominal   Peds  Hematology Lab Results      Component                Value               Date                      WBC                      7.1                 10/01/2022                HGB                      13.5                10/01/2022                HCT                      41.4                10/01/2022                MCV                      84.5                10/01/2022                PLT                      297                 10/01/2022             T &S available   Anesthesia Other Findings All: latex , hydrocodone  Reproductive/Obstetrics negative OB ROS                            Anesthesia Physical Anesthesia Plan  ASA: 1  Anesthesia Plan: General   Post-op Pain Management: Lidocaine infusion*, Tylenol PO (pre-op)* and Ketamine IV*   Induction: Intravenous  PONV Risk Score and Plan: 4 or greater and Treatment may vary due to age or medical condition, Ondansetron, Midazolam and Dexamethasone  Airway Management Planned: Oral ETT  Additional Equipment: None  Intra-op Plan:   Post-operative Plan: Extubation in OR  Informed Consent: I have reviewed the patients  History and Physical, chart, labs and discussed the procedure including the risks,  benefits and alternatives for the proposed anesthesia with the patient or authorized representative who has indicated his/her understanding and acceptance.     Dental advisory given  Plan Discussed with: CRNA  Anesthesia Plan Comments:        Anesthesia Quick Evaluation

## 2022-10-03 ENCOUNTER — Ambulatory Visit (HOSPITAL_BASED_OUTPATIENT_CLINIC_OR_DEPARTMENT_OTHER): Payer: 59 | Admitting: Anesthesiology

## 2022-10-03 ENCOUNTER — Encounter (HOSPITAL_BASED_OUTPATIENT_CLINIC_OR_DEPARTMENT_OTHER): Payer: Self-pay | Admitting: Obstetrics and Gynecology

## 2022-10-03 ENCOUNTER — Other Ambulatory Visit: Payer: Self-pay

## 2022-10-03 ENCOUNTER — Ambulatory Visit (HOSPITAL_BASED_OUTPATIENT_CLINIC_OR_DEPARTMENT_OTHER)
Admission: RE | Admit: 2022-10-03 | Discharge: 2022-10-03 | Disposition: A | Discharge status: Home / Self Care | Payer: 59 | Attending: Obstetrics and Gynecology | Admitting: Obstetrics and Gynecology

## 2022-10-03 ENCOUNTER — Encounter (HOSPITAL_BASED_OUTPATIENT_CLINIC_OR_DEPARTMENT_OTHER)
Admission: RE | Disposition: A | Discharge status: Home / Self Care | Payer: Self-pay | Source: Home / Self Care | Attending: Obstetrics and Gynecology

## 2022-10-03 DIAGNOSIS — Z4003 Encounter for prophylactic removal of fallopian tube(s): Secondary | ICD-10-CM | POA: Diagnosis not present

## 2022-10-03 DIAGNOSIS — Z4009 Encounter for prophylactic removal of other organ: Secondary | ICD-10-CM | POA: Diagnosis present

## 2022-10-03 DIAGNOSIS — N83202 Unspecified ovarian cyst, left side: Secondary | ICD-10-CM | POA: Diagnosis not present

## 2022-10-03 DIAGNOSIS — Z4002 Encounter for prophylactic removal of ovary: Secondary | ICD-10-CM | POA: Diagnosis not present

## 2022-10-03 DIAGNOSIS — N83201 Unspecified ovarian cyst, right side: Secondary | ICD-10-CM | POA: Diagnosis not present

## 2022-10-03 DIAGNOSIS — Z01818 Encounter for other preprocedural examination: Secondary | ICD-10-CM

## 2022-10-03 DIAGNOSIS — Z1509 Genetic susceptibility to other malignant neoplasm: Secondary | ICD-10-CM | POA: Insufficient documentation

## 2022-10-03 DIAGNOSIS — Z90710 Acquired absence of both cervix and uterus: Secondary | ICD-10-CM | POA: Diagnosis present

## 2022-10-03 DIAGNOSIS — Z87891 Personal history of nicotine dependence: Secondary | ICD-10-CM | POA: Insufficient documentation

## 2022-10-03 DIAGNOSIS — D270 Benign neoplasm of right ovary: Secondary | ICD-10-CM | POA: Insufficient documentation

## 2022-10-03 HISTORY — PX: TOTAL LAPAROSCOPIC HYSTERECTOMY WITH BILATERAL SALPINGO OOPHORECTOMY: SHX6845

## 2022-10-03 HISTORY — DX: Presence of spectacles and contact lenses: Z97.3

## 2022-10-03 HISTORY — PX: CYSTOSCOPY: SHX5120

## 2022-10-03 LAB — TYPE AND SCREEN
ABO/RH(D): O POS
Antibody Screen: NEGATIVE

## 2022-10-03 LAB — POCT PREGNANCY, URINE: Preg Test, Ur: NEGATIVE

## 2022-10-03 SURGERY — HYSTERECTOMY, TOTAL, LAPAROSCOPIC, WITH BILATERAL SALPINGO-OOPHORECTOMY
Anesthesia: General | Site: Bladder

## 2022-10-03 MED ORDER — PROPOFOL 10 MG/ML IV BOLUS
INTRAVENOUS | Status: AC
  Filled 2022-10-03: qty 20

## 2022-10-03 MED ORDER — HYDROMORPHONE HCL 1 MG/ML IJ SOLN
0.2500 mg | INTRAMUSCULAR | Status: DC | PRN
  Administered 2022-10-03 (×2): 0.25 mg via INTRAVENOUS

## 2022-10-03 MED ORDER — ACETAMINOPHEN 500 MG PO TABS
ORAL_TABLET | ORAL | Status: AC
  Filled 2022-10-03: qty 2

## 2022-10-03 MED ORDER — OXYCODONE HCL 5 MG PO TABS
5.0000 mg | ORAL_TABLET | ORAL | Status: DC | PRN
  Administered 2022-10-03: 10 mg via ORAL
  Administered 2022-10-03: 5 mg via ORAL

## 2022-10-03 MED ORDER — DIPHENHYDRAMINE HCL 50 MG/ML IJ SOLN
INTRAMUSCULAR | Status: AC
  Filled 2022-10-03: qty 1

## 2022-10-03 MED ORDER — BUPIVACAINE HCL (PF) 0.25 % IJ SOLN
INTRAMUSCULAR | Status: DC | PRN
  Administered 2022-10-03: 16 mL

## 2022-10-03 MED ORDER — SCOPOLAMINE 1 MG/3DAYS TD PT72
MEDICATED_PATCH | TRANSDERMAL | Status: AC
  Filled 2022-10-03: qty 1

## 2022-10-03 MED ORDER — ACETAMINOPHEN 500 MG PO TABS
1000.0000 mg | ORAL_TABLET | Freq: Four times a day (QID) | ORAL | Status: DC
  Administered 2022-10-03: 1000 mg via ORAL

## 2022-10-03 MED ORDER — FENTANYL CITRATE (PF) 100 MCG/2ML IJ SOLN
INTRAMUSCULAR | Status: DC | PRN
  Administered 2022-10-03: 100 ug via INTRAVENOUS
  Administered 2022-10-03: 50 ug via INTRAVENOUS

## 2022-10-03 MED ORDER — DIPHENHYDRAMINE HCL 50 MG/ML IJ SOLN
INTRAMUSCULAR | Status: DC | PRN
  Administered 2022-10-03: 12.5 mg via INTRAVENOUS

## 2022-10-03 MED ORDER — BISACODYL 10 MG RE SUPP: 10.0000 mg | Freq: Every day | RECTAL | Status: DC | PRN

## 2022-10-03 MED ORDER — HYDROMORPHONE HCL 1 MG/ML IJ SOLN
INTRAMUSCULAR | Status: AC
  Filled 2022-10-03: qty 1

## 2022-10-03 MED ORDER — SUGAMMADEX SODIUM 200 MG/2ML IV SOLN
INTRAVENOUS | Status: DC | PRN
  Administered 2022-10-03: 200 mg via INTRAVENOUS

## 2022-10-03 MED ORDER — HYDROMORPHONE HCL 1 MG/ML IJ SOLN
1.0000 mg | INTRAMUSCULAR | Status: DC | PRN
  Administered 2022-10-03: 1 mg via INTRAVENOUS

## 2022-10-03 MED ORDER — ACETAMINOPHEN 500 MG PO TABS
1000.0000 mg | ORAL_TABLET | ORAL | Status: AC
  Administered 2022-10-03: 1000 mg via ORAL

## 2022-10-03 MED ORDER — ROCURONIUM BROMIDE 10 MG/ML (PF) SYRINGE
PREFILLED_SYRINGE | INTRAVENOUS | Status: DC | PRN
  Administered 2022-10-03: 70 mg via INTRAVENOUS

## 2022-10-03 MED ORDER — SIMETHICONE 80 MG PO CHEW: 80.0000 mg | CHEWABLE_TABLET | Freq: Four times a day (QID) | ORAL | Status: DC | PRN

## 2022-10-03 MED ORDER — KETOROLAC TROMETHAMINE 30 MG/ML IJ SOLN
INTRAMUSCULAR | Status: AC
  Filled 2022-10-03: qty 1

## 2022-10-03 MED ORDER — ONDANSETRON HCL 4 MG/2ML IJ SOLN
INTRAMUSCULAR | Status: DC | PRN
  Administered 2022-10-03: 4 mg via INTRAVENOUS

## 2022-10-03 MED ORDER — OXYCODONE HCL 5 MG PO TABS
5.0000 mg | ORAL_TABLET | Freq: Once | ORAL | Status: AC | PRN
  Administered 2022-10-03: 5 mg via ORAL

## 2022-10-03 MED ORDER — LACTATED RINGERS IV SOLN: INTRAVENOUS | Status: DC

## 2022-10-03 MED ORDER — OXYCODONE HCL 5 MG PO TABS
ORAL_TABLET | ORAL | Status: AC
  Filled 2022-10-03: qty 1

## 2022-10-03 MED ORDER — CEFAZOLIN SODIUM-DEXTROSE 2-4 GM/100ML-% IV SOLN
2.0000 g | INTRAVENOUS | Status: AC
  Administered 2022-10-03: 2 g via INTRAVENOUS

## 2022-10-03 MED ORDER — ONDANSETRON HCL 4 MG PO TABS: 4.0000 mg | ORAL_TABLET | Freq: Four times a day (QID) | ORAL | Status: DC | PRN

## 2022-10-03 MED ORDER — KETAMINE HCL 50 MG/5ML IJ SOSY
PREFILLED_SYRINGE | INTRAMUSCULAR | Status: AC
  Filled 2022-10-03: qty 5

## 2022-10-03 MED ORDER — FENTANYL CITRATE (PF) 100 MCG/2ML IJ SOLN
INTRAMUSCULAR | Status: AC
  Filled 2022-10-03: qty 2

## 2022-10-03 MED ORDER — MIDAZOLAM HCL 2 MG/2ML IJ SOLN
INTRAMUSCULAR | Status: AC
  Filled 2022-10-03: qty 2

## 2022-10-03 MED ORDER — OXYCODONE HCL 5 MG PO TABS: 5.0000 mg | ORAL_TABLET | ORAL | 0 refills | Status: DC | PRN

## 2022-10-03 MED ORDER — KETAMINE HCL 10 MG/ML IJ SOLN
INTRAMUSCULAR | Status: DC | PRN
  Administered 2022-10-03: 10 mg via INTRAVENOUS
  Administered 2022-10-03: 20 mg via INTRAVENOUS

## 2022-10-03 MED ORDER — KETOROLAC TROMETHAMINE 30 MG/ML IJ SOLN
INTRAMUSCULAR | Status: DC | PRN
  Administered 2022-10-03: 30 mg via INTRAVENOUS

## 2022-10-03 MED ORDER — PROPOFOL 10 MG/ML IV BOLUS
INTRAVENOUS | Status: DC | PRN
  Administered 2022-10-03: 30 mg via INTRAVENOUS
  Administered 2022-10-03: 170 mg via INTRAVENOUS

## 2022-10-03 MED ORDER — ALUM & MAG HYDROXIDE-SIMETH 200-200-20 MG/5ML PO SUSP: 30.0000 mL | ORAL | Status: DC | PRN

## 2022-10-03 MED ORDER — GABAPENTIN 300 MG PO CAPS
300.0000 mg | ORAL_CAPSULE | Freq: Three times a day (TID) | ORAL | Status: DC
  Administered 2022-10-03: 300 mg via ORAL

## 2022-10-03 MED ORDER — DEXTROSE-NACL 5-0.45 % IV SOLN: INTRAVENOUS | Status: DC

## 2022-10-03 MED ORDER — PHENYLEPHRINE 80 MCG/ML (10ML) SYRINGE FOR IV PUSH (FOR BLOOD PRESSURE SUPPORT)
PREFILLED_SYRINGE | INTRAVENOUS | Status: AC
  Filled 2022-10-03: qty 10

## 2022-10-03 MED ORDER — KETOROLAC TROMETHAMINE 30 MG/ML IJ SOLN: 30.0000 mg | Freq: Once | INTRAMUSCULAR | Status: DC | PRN

## 2022-10-03 MED ORDER — PHENYLEPHRINE 80 MCG/ML (10ML) SYRINGE FOR IV PUSH (FOR BLOOD PRESSURE SUPPORT)
PREFILLED_SYRINGE | INTRAVENOUS | Status: DC | PRN
  Administered 2022-10-03: 80 ug via INTRAVENOUS

## 2022-10-03 MED ORDER — ONDANSETRON HCL 4 MG/2ML IJ SOLN
INTRAMUSCULAR | Status: AC
  Filled 2022-10-03: qty 2

## 2022-10-03 MED ORDER — IBUPROFEN 600 MG PO TABS: 600.0000 mg | ORAL_TABLET | Freq: Four times a day (QID) | ORAL | 0 refills | Status: DC

## 2022-10-03 MED ORDER — KETOROLAC TROMETHAMINE 30 MG/ML IJ SOLN
30.0000 mg | Freq: Four times a day (QID) | INTRAMUSCULAR | Status: DC
  Administered 2022-10-03: 30 mg via INTRAVENOUS

## 2022-10-03 MED ORDER — OXYCODONE HCL 5 MG/5ML PO SOLN: 5.0000 mg | Freq: Once | ORAL | Status: AC | PRN

## 2022-10-03 MED ORDER — ONDANSETRON HCL 4 MG/2ML IJ SOLN: 4.0000 mg | Freq: Once | INTRAMUSCULAR | Status: DC | PRN

## 2022-10-03 MED ORDER — DEXAMETHASONE SODIUM PHOSPHATE 10 MG/ML IJ SOLN
INTRAMUSCULAR | Status: DC | PRN
  Administered 2022-10-03: 10 mg via INTRAVENOUS

## 2022-10-03 MED ORDER — GABAPENTIN 300 MG PO CAPS
ORAL_CAPSULE | ORAL | Status: AC
  Filled 2022-10-03: qty 1

## 2022-10-03 MED ORDER — CEFAZOLIN SODIUM-DEXTROSE 2-4 GM/100ML-% IV SOLN
INTRAVENOUS | Status: AC
  Filled 2022-10-03: qty 100

## 2022-10-03 MED ORDER — SODIUM CHLORIDE 0.9 % IR SOLN
Status: DC | PRN
  Administered 2022-10-03: 1000 mL

## 2022-10-03 MED ORDER — FENTANYL CITRATE (PF) 250 MCG/5ML IJ SOLN
INTRAMUSCULAR | Status: AC
  Filled 2022-10-03: qty 5

## 2022-10-03 MED ORDER — ROCURONIUM BROMIDE 10 MG/ML (PF) SYRINGE
PREFILLED_SYRINGE | INTRAVENOUS | Status: AC
  Filled 2022-10-03: qty 10

## 2022-10-03 MED ORDER — ONDANSETRON HCL 4 MG/2ML IJ SOLN: 4.0000 mg | Freq: Four times a day (QID) | INTRAMUSCULAR | Status: DC | PRN

## 2022-10-03 MED ORDER — GABAPENTIN 300 MG PO CAPS: 300.0000 mg | ORAL_CAPSULE | Freq: Three times a day (TID) | ORAL | 0 refills | Status: DC

## 2022-10-03 MED ORDER — BUPIVACAINE HCL 0.5 % IJ SOLN
INTRAMUSCULAR | Status: DC | PRN
  Administered 2022-10-03: 15 mL

## 2022-10-03 MED ORDER — MENTHOL 3 MG MT LOZG: 1.0000 | LOZENGE | OROMUCOSAL | Status: DC | PRN

## 2022-10-03 MED ORDER — OXYCODONE HCL 5 MG PO TABS
ORAL_TABLET | ORAL | Status: AC
  Filled 2022-10-03: qty 2

## 2022-10-03 MED ORDER — GABAPENTIN 300 MG PO CAPS
300.0000 mg | ORAL_CAPSULE | ORAL | Status: AC
  Administered 2022-10-03: 300 mg via ORAL

## 2022-10-03 MED ORDER — ARTIFICIAL TEARS OPHTHALMIC OINT
TOPICAL_OINTMENT | OPHTHALMIC | Status: AC
  Filled 2022-10-03: qty 3.5

## 2022-10-03 MED ORDER — MIDAZOLAM HCL 2 MG/2ML IJ SOLN
INTRAMUSCULAR | Status: DC | PRN
  Administered 2022-10-03: 2 mg via INTRAVENOUS

## 2022-10-03 MED ORDER — IBUPROFEN 200 MG PO TABS: 600.0000 mg | ORAL_TABLET | Freq: Four times a day (QID) | ORAL | Status: DC

## 2022-10-03 MED ORDER — LIDOCAINE 2% (20 MG/ML) 5 ML SYRINGE
INTRAMUSCULAR | Status: DC | PRN
  Administered 2022-10-03: 100 mg via INTRAVENOUS

## 2022-10-03 MED ORDER — DEXAMETHASONE SODIUM PHOSPHATE 10 MG/ML IJ SOLN
INTRAMUSCULAR | Status: AC
  Filled 2022-10-03: qty 1

## 2022-10-03 MED ORDER — LIDOCAINE HCL (PF) 2 % IJ SOLN
INTRAMUSCULAR | Status: AC
  Filled 2022-10-03: qty 5

## 2022-10-03 MED ORDER — 0.9 % SODIUM CHLORIDE (POUR BTL) OPTIME
TOPICAL | Status: DC | PRN
  Administered 2022-10-03: 500 mL

## 2022-10-03 MED ORDER — SCOPOLAMINE 1 MG/3DAYS TD PT72
1.0000 | MEDICATED_PATCH | TRANSDERMAL | Status: DC
  Administered 2022-10-03: 1.5 mg via TRANSDERMAL

## 2022-10-03 SURGICAL SUPPLY — 57 items
ADH SKN CLS APL DERMABOND .7 (GAUZE/BANDAGES/DRESSINGS) ×2
APL SRG 38 LTWT LNG FL B (MISCELLANEOUS)
APPLICATOR ARISTA FLEXITIP XL (MISCELLANEOUS) IMPLANT
BARRIER ADHS 3X4 INTERCEED (GAUZE/BANDAGES/DRESSINGS) IMPLANT
BRR ADH 4X3 ABS CNTRL BYND (GAUZE/BANDAGES/DRESSINGS)
CABLE HIGH FREQUENCY MONO STRZ (ELECTRODE) IMPLANT
COVER BACK TABLE 60X90IN (DRAPES) IMPLANT
COVER MAYO STAND STRL (DRAPES) ×2 IMPLANT
DERMABOND ADVANCED .7 DNX12 (GAUZE/BANDAGES/DRESSINGS) ×2 IMPLANT
DEVICE SUTURE ENDOST 10MM (ENDOMECHANICALS) IMPLANT
DRSG OPSITE POSTOP 4X10 (GAUZE/BANDAGES/DRESSINGS) IMPLANT
DURAPREP 26ML APPLICATOR (WOUND CARE) ×2 IMPLANT
GAUZE 4X4 16PLY ~~LOC~~+RFID DBL (SPONGE) ×2 IMPLANT
GLOVE BIOGEL PI IND STRL 6.5 (GLOVE) IMPLANT
GLOVE BIOGEL PI IND STRL 7.5 (GLOVE) IMPLANT
GLOVE BIOGEL PI IND STRL 8 (GLOVE) ×2 IMPLANT
GLOVE ORTHO TXT STRL SZ7.5 (GLOVE) ×4 IMPLANT
GLOVE SURG SS PI 6.5 STRL IVOR (GLOVE) IMPLANT
GLOVE SURG SS PI 8.0 STRL IVOR (GLOVE) IMPLANT
GOWN STRL REUS W/TWL LRG LVL3 (GOWN DISPOSABLE) ×2 IMPLANT
GOWN STRL REUS W/TWL XL LVL3 (GOWN DISPOSABLE) ×4 IMPLANT
HEMOSTAT ARISTA ABSORB 3G PWDR (HEMOSTASIS) IMPLANT
KIT TURNOVER CYSTO (KITS) ×2 IMPLANT
NDL INSUFFLATION 14GA 120MM (NEEDLE) ×2 IMPLANT
NDL SPNL 22GX3.5 QUINCKE BK (NEEDLE) ×2 IMPLANT
NEEDLE INSUFFLATION 14GA 120MM (NEEDLE) ×2 IMPLANT
NEEDLE SPNL 22GX3.5 QUINCKE BK (NEEDLE) ×2 IMPLANT
NS IRRIG 1000ML POUR BTL (IV SOLUTION) ×2 IMPLANT
OCCLUDER COLPOPNEUMO (BALLOONS) ×2 IMPLANT
PACK LAPAROSCOPY BASIN (CUSTOM PROCEDURE TRAY) ×2 IMPLANT
PACK TRENDGUARD 450 HYBRID PRO (MISCELLANEOUS) ×2 IMPLANT
PROTECTOR NERVE ULNAR (MISCELLANEOUS) ×4 IMPLANT
SCISSORS LAP 5X35 DISP (ENDOMECHANICALS) IMPLANT
SET IRRIG Y TYPE TUR BLADDER L (SET/KITS/TRAYS/PACK) IMPLANT
SET SUCTION IRRIG HYDROSURG (IRRIGATION / IRRIGATOR) ×2 IMPLANT
SET TRI-LUMEN FLTR TB AIRSEAL (TUBING) ×2 IMPLANT
SHEARS 1100 HARMONIC 36 (ELECTROSURGICAL) IMPLANT
SUT ENDO VLOC 180-0-8IN (SUTURE) IMPLANT
SUT VIC AB 0 CT1 36 (SUTURE) IMPLANT
SUT VIC AB 4-0 PS2 27 (SUTURE) ×2 IMPLANT
SUT VICRYL 0 UR6 27IN ABS (SUTURE) ×2 IMPLANT
SUT VLOC 180 0 9IN  GS21 (SUTURE)
SUT VLOC 180 0 9IN GS21 (SUTURE) ×2 IMPLANT
SYR 10ML LL (SYRINGE) ×2 IMPLANT
SYR 50ML LL SCALE MARK (SYRINGE) ×2 IMPLANT
SYR CONTROL 10ML LL (SYRINGE) ×2 IMPLANT
TIP UTERINE 5.1X6CM LAV DISP (MISCELLANEOUS) IMPLANT
TIP UTERINE 6.7X10CM GRN DISP (MISCELLANEOUS) IMPLANT
TIP UTERINE 6.7X6CM WHT DISP (MISCELLANEOUS) IMPLANT
TIP UTERINE 6.7X8CM BLUE DISP (MISCELLANEOUS) IMPLANT
TOWEL OR 17X26 10 PK STRL BLUE (TOWEL DISPOSABLE) ×2 IMPLANT
TRAY FOLEY W/BAG SLVR 14FR LF (SET/KITS/TRAYS/PACK) ×2 IMPLANT
TRENDGUARD 450 HYBRID PRO PACK (MISCELLANEOUS) ×2
TROCAR PORT AIRSEAL 5X120 (TROCAR) ×2 IMPLANT
TROCAR Z-THREAD BLADED 11X100M (TROCAR) ×2 IMPLANT
TROCAR Z-THREAD FIOS 5X100MM (TROCAR) ×2 IMPLANT
WARMER LAPAROSCOPE (MISCELLANEOUS) ×2 IMPLANT

## 2022-10-03 NOTE — Interval H&P Note (Signed)
History and Physical Interval Note:  10/03/2022 7:45 AM  Stephanie Brandt  has presented today for surgery, with the diagnosis of lynch syndrome.  The various methods of treatment have been discussed with the patient and family. After consideration of risks, benefits and other options for treatment, the patient has consented to  Procedure(s): TOTAL LAPAROSCOPIC HYSTERECTOMY WITH BILATERAL SALPINGO OOPHORECTOMY (Bilateral) CYSTOSCOPY (N/A) as a surgical intervention.  The patient's history has been reviewed, patient examined, no change in status, stable for surgery.  I have reviewed the patient's chart and labs.  Questions were answered to the patient's satisfaction.     Blane Ohara Currie Dennin

## 2022-10-03 NOTE — Progress Notes (Signed)
Post-op check, s/p TLH/BSO Doing well, mild pain, tol PO, ambulating, voiding Afeb, VSS Abd- soft, mild tenderness, ND D/c home

## 2022-10-03 NOTE — Anesthesia Postprocedure Evaluation (Signed)
Anesthesia Post Note  Patient: Stephanie Brandt  Procedure(s) Performed: TOTAL LAPAROSCOPIC HYSTERECTOMY WITH BILATERAL SALPINGO OOPHORECTOMY (Bilateral: Abdomen) CYSTOSCOPY (Bladder)     Patient location during evaluation: PACU Anesthesia Type: General Level of consciousness: awake and alert Pain management: pain level controlled Vital Signs Assessment: post-procedure vital signs reviewed and stable Respiratory status: spontaneous breathing, nonlabored ventilation, respiratory function stable and [name]   No notable events documented.  Last Vitals:  Vitals:   10/03/22 1045 10/03/22 1115  BP: 116/82 122/76  Pulse: 72 74  Resp: 15 15  Temp: 36.8 C 36.6 C  SpO2: 97% 97%    Last Pain:  Vitals:   10/03/22 1115  TempSrc: Oral  PainSc:                  Barnet Glasgow

## 2022-10-03 NOTE — Discharge Instructions (Signed)
Routine instructions for laparoscopic hysterectomy

## 2022-10-03 NOTE — Op Note (Signed)
Preoperative diagnosis: Lynch syndrome Postoperative diagnosis: Same  Procedure: Total laparoscopic hysterectomy, bilateral salpingooophorectomy, cystoscopy Surgeon: Cheri Fowler M.D.  Assistant: Marrian Salvage, MD Anesthesia: Gen. Endotracheal tube  Findings: She had an essentially normal pelvis, normal uterus, tubes and normal ovaries.  Assistance from Dr. Wilhelmenia Blase was necessary to take down pedicles on her side and for retraction Specimens: Uterus, tubes and ovaries for routine pathology  Estimated blood loss: 50 cc  Complications: None  Procedure in detail:  The patient was taken to the operating room and placed in the dorsosupine position. General anesthesia was induced. Arms were tucked to her sides and legs were placed in mobile stirrups. Abdomen perineum and vagina were then prepped and draped in usual sterile fashion. A 68m RUMI with the medium sized metal cup was then applied to the uterus and cervix for uterine manipulation and a Foley catheter was inserted. Infraumbilical skin was infiltrated with quarter percent Marcaine and a 1 cm vertical incision was made. A veress needle was inserted into the peritoneal cavity and placement confirmed by the water drop test an opening pressure of 2 mm of mercury. 3 liters of CO2 was insufflated and the veress needle was removed. A 5 mm trocar was then introduced with direct visualization with the laparoscope. A 5 mm airseal port was then also placed on the right side and a 10/11 trocar placed on the left side under direct visualization. Inspection revealed the above-mentioned findings. The distal right fallopian tube was grasped with a grasper from the left side. The Harmonic scalpel Ace was used to take down the right IP ligament, mesosalpinx, round ligament, utero-ovarian pedicle and broad ligament were then also taken down. The anterior peritoneum was incised across the anterior portion of the uterus to help release the bladder. Uterine artery artery  was skeletonized and taken down with the harmonic scalpel Ace with adequate division and adequate hemostasis. A similar procedure was then performed on the patient's left side taking down the IP ligament, mesosalpinx, round ligament, utero-ovarian pedicle, and broad ligament. Anterior peritoneum was incised across the anterior portion the uterus to meet the incision coming from the patient's right side, taking down some adhesions along the way. Uterine artery was skeletonized and taken down with the Harmonic Scalpel with adequate division and adequate hemostasis. I then began to detach the cervix from the vagina. The RUMI cup was pushed superiorly, the Harmonic scalpel was placed on the cup edge and a circumferential incision was made starting anteriorly into the vagina. This released the uterosacral ligaments as well as all attachments to the vagina. At this point all pedicles appeared to be hemostatic and there was no other pathology noted.  The uterus was pulled into the vagina.  The pelvis was irrigated and found to be hemostatic.  The vaginal cuff was then closed with the Endostitch device using 0 V-lok suture.   The pelvis was again irrigated and found to be hemostatic with good closure of the vaginal cuff.  The lateral ports were removed under direct visualization and all gas was allowed to deflate from the abdomen. The umbilical trocar was then removed. A 0 Vicryl suture was placed as deep as possible on the left side where the 10/11 port was placed.  Skin incisions were closed with interrupted subcuticular sutures of 4-0 Vicryl followed by Dermabond.   Attention was turned to cystoscopy.  The uterus was then removed from the vagina.  Foley catheter was removed.  70 degree cystoscope inserted and 200 cc fluid instilled.  Bladder was normal, urine flowed from each ureteral orifice.  Fluid was drained and cystoscope removed.  The patient tolerated the procedure well. She was taken to the recovery in stable  condition. Counts were correct x2, she received Anmcef 2 gm IV at the beginning of the procedure and had PAS hose on throughout the procedure.

## 2022-10-03 NOTE — Anesthesia Procedure Notes (Signed)
Procedure Name: Intubation Date/Time: 10/03/2022 8:07 AM  Performed by: Mechele Claude, CRNAPre-anesthesia Checklist: Patient identified, Emergency Drugs available, Suction available and Patient being monitored Patient Re-evaluated:Patient Re-evaluated prior to induction Oxygen Delivery Method: Circle system utilized Preoxygenation: Pre-oxygenation with 100% oxygen Induction Type: IV induction Ventilation: Mask ventilation without difficulty Laryngoscope Size: Mac and 3 Tube type: Oral Tube size: 7.0 mm Number of attempts: 1 Airway Equipment and Method: Stylet and Oral airway Placement Confirmation: ETT inserted through vocal cords under direct vision, positive ETCO2 and breath sounds checked- equal and bilateral Secured at: 22 cm Tube secured with: Tape Dental Injury: Teeth and Oropharynx as per pre-operative assessment

## 2022-10-03 NOTE — Transfer of Care (Signed)
Immediate Anesthesia Transfer of Care Note  Patient: MAGDELYN ROEBUCK  Procedure(s) Performed: Procedure(s) (LRB): TOTAL LAPAROSCOPIC HYSTERECTOMY WITH BILATERAL SALPINGO OOPHORECTOMY (Bilateral) CYSTOSCOPY (N/A)  Patient Location: PACU  Anesthesia Type: General  Level of Consciousness: awake, alert  and oriented  Airway & Oxygen Therapy: Patient Spontanous Breathing and Patient connected to nasal cannula oxygen  Post-op Assessment: Report given to PACU RN and Post -op Vital signs reviewed and stable  Post vital signs: Reviewed and stable  Complications: No apparent anesthesia complications  Last Vitals:  Vitals Value Taken Time  BP 126/86 10/03/22 0946  Temp    Pulse 82 10/03/22 0951  Resp 19 10/03/22 0951  SpO2 100 % 10/03/22 0951  Vitals shown include unvalidated device data.  Last Pain:  Vitals:   10/03/22 0705  TempSrc: Oral         Complications: No notable events documented.

## 2022-10-07 ENCOUNTER — Encounter (HOSPITAL_BASED_OUTPATIENT_CLINIC_OR_DEPARTMENT_OTHER): Payer: Self-pay | Admitting: Obstetrics and Gynecology

## 2022-10-07 LAB — SURGICAL PATHOLOGY

## 2023-01-31 ENCOUNTER — Ambulatory Visit: Payer: 59 | Admitting: Podiatry

## 2023-02-19 ENCOUNTER — Encounter: Payer: Self-pay | Admitting: Internal Medicine

## 2023-06-09 ENCOUNTER — Ambulatory Visit (INDEPENDENT_AMBULATORY_CARE_PROVIDER_SITE_OTHER): Payer: 59 | Admitting: Internal Medicine

## 2023-06-09 ENCOUNTER — Encounter: Payer: Self-pay | Admitting: Internal Medicine

## 2023-06-09 VITALS — BP 104/62 | HR 65 | Temp 96.6°F | Ht 67.0 in | Wt 164.0 lb

## 2023-06-09 DIAGNOSIS — E663 Overweight: Secondary | ICD-10-CM | POA: Diagnosis not present

## 2023-06-09 DIAGNOSIS — Z0001 Encounter for general adult medical examination with abnormal findings: Secondary | ICD-10-CM | POA: Diagnosis not present

## 2023-06-09 DIAGNOSIS — R7309 Other abnormal glucose: Secondary | ICD-10-CM | POA: Diagnosis not present

## 2023-06-09 DIAGNOSIS — Z1159 Encounter for screening for other viral diseases: Secondary | ICD-10-CM | POA: Diagnosis not present

## 2023-06-09 DIAGNOSIS — Z6825 Body mass index (BMI) 25.0-25.9, adult: Secondary | ICD-10-CM | POA: Insufficient documentation

## 2023-06-09 NOTE — Progress Notes (Signed)
Subjective:    Patient ID: Stephanie Brandt, female    DOB: [birth year], 32 y.o.   MRN: [id number]  HPI  Patient presents to clinic today for her annual exam.  Flu: Never Tetanus: 12/2006 COVID: Never Pap smear: Hysterectomy Dentist: as needed  Diet: She does eat meat. She consumes fruits and veggies. She tries to avoid fried foods. She drinks mostly water. Exercise: Strength train 3-4 times per week, walking  Review of Systems     Past Medical History:  Diagnosis Date   Anxiety    Lynch syndrome    Risk for ovarian, uterine, & colon cancer. Yearly colonscopies with Dr. Artis Flock at [facility]   Wears glasses     Current Outpatient Medications  Medication Sig Dispense Refill   gabapentin (NEURONTIN) 300 MG capsule Take 1 capsule (300 mg total) by mouth 3 (three) times daily for 2 days. 6 capsule 0   ibuprofen (ADVIL) 600 MG tablet Take 1 tablet (600 mg total) by mouth every 6 (six) hours. 30 tablet 0   oxyCODONE (OXY IR/ROXICODONE) 5 MG immediate release tablet Take 1 tablet (5 mg total) by mouth every 4 (four) hours as needed for severe pain. 15 tablet 0   No current facility-administered medications for this visit.    Allergies  Allergen Reactions   Latex Hives   Hydrocodone Hives    Family History  Problem Relation Age of Onset   Cancer Father    Hypertension Mother     Social History   Socioeconomic History   Marital status: Single    Spouse name: Not on file   Number of children: Not on file   Years of education: Not on file   Highest education level: Not on file  Occupational History   Not on file  Tobacco Use   Smoking status: Former    Years: 4    Types: Cigarettes    Quit date: 2016    Years since quitting: 8.5   Smokeless tobacco: Never   Tobacco comments:    quit 2018  Vaping Use   Vaping Use: Never used  Substance and Sexual Activity   Alcohol use: Not Currently    Comment: seldom   Drug use: Never   Sexual activity: Yes    Birth  control/protection: None  Other Topics Concern   Not on file  Social History Narrative   Not on file   Social Determinants of Health   Financial Resource Strain: Not on file  Food Insecurity: Not on file  Transportation Needs: Not on file  Physical Activity: Not on file  Stress: Not on file  Social Connections: Not on file  Intimate Partner Violence: Not on file     Constitutional: Denies fever, malaise, fatigue, headache or abrupt weight changes.  HEENT: Denies eye pain, eye redness, ear pain, ringing in the ears, wax buildup, runny nose, nasal congestion, bloody nose, or sore throat. Respiratory: Denies difficulty breathing, shortness of breath, cough or sputum production.   Cardiovascular: Denies chest pain, chest tightness, palpitations or swelling in the hands or feet.  Gastrointestinal: Denies abdominal pain, bloating, constipation, diarrhea or blood in the stool.  GU: Denies urgency, frequency, pain with urination, burning sensation, blood in urine, odor or discharge. Musculoskeletal: Denies decrease in range of motion, difficulty with gait, muscle pain or joint pain and swelling.  Skin: Denies redness, rashes, lesions or ulcercations.  Neurological: Denies dizziness, difficulty with memory, difficulty with speech or problems with balance and coordination.  Psych: Patient  has a history of anxiety.  Denies depression, SI/HI.  No other specific complaints in a complete review of systems (except as listed in HPI above).  Objective:   Physical Exam  BP 104/62 (BP Location: Left Arm, Patient Position: Sitting, Cuff Size: Normal)   Pulse 65   Temp (!) 96.6 F (35.9 C) (Temporal)   Ht 5\' 7"  (1.702 m)   Wt 164 lb (74.4 kg)   SpO2 98%   BMI 25.69 kg/m   Wt Readings from Last 3 Encounters:  10/03/22 178 lb 3.2 oz (80.8 kg)  10/01/22 175 lb 6.4 oz (79.6 kg)  07/05/21 172 lb 12 oz (78.4 kg)    General: Appears her stated age, overweight in NAD. Skin: Warm, dry and  intact.  HEENT: Head: normal shape and size; Eyes: sclera white, no icterus, conjunctiva pink, PERRLA and EOMs intact;  Neck:  Neck supple, trachea midline. No masses, lumps or thyromegaly present.  Cardiovascular: Normal rate and rhythm. S1,S2 noted.  No murmur, rubs or gallops noted. No JVD or BLE edema.  Pulmonary/Chest: Normal effort and positive vesicular breath sounds. No respiratory distress. No wheezes, rales or ronchi noted.  Abdomen: Normal bowel sounds.  Musculoskeletal: Strength 5/5 BUE/BLE. No difficulty with gait.  Neurological: Alert and oriented. Cranial nerves II-XII grossly intact. Coordination normal.  Psychiatric: Mood and affect normal. Behavior is normal. Judgment and thought content normal.     BMET    Component Value Date/Time   NA 137 09/26/2020 1456   NA 138 06/02/2017 1514   NA 138 03/29/2014 0638   K 4.1 09/26/2020 1456   K 3.7 03/29/2014 0638   CL 104 09/26/2020 1456   CL 107 03/29/2014 0638   CO2 27 09/26/2020 1456   CO2 26 03/29/2014 0638   GLUCOSE 77 09/26/2020 1456   GLUCOSE 108 (H) 03/29/2014 0638   BUN 13 09/26/2020 1456   BUN 11 06/02/2017 1514   BUN 16 03/29/2014 0638   CREATININE 0.76 09/26/2020 1456   CREATININE 0.72 03/29/2014 0638   CALCIUM 8.9 09/26/2020 1456   CALCIUM 8.8 03/29/2014 0638   GFRNONAA 127 06/02/2017 1514   GFRNONAA >60 03/29/2014 0638   GFRAA 147 06/02/2017 1514   GFRAA >60 03/29/2014 0638    Lipid Panel     Component Value Date/Time   CHOL 125 09/26/2020 1456   CHOL 113 06/02/2017 1514   TRIG 129.0 09/26/2020 1456   HDL 35.80 (L) 09/26/2020 1456   HDL 32 (L) 06/02/2017 1514   CHOLHDL 3 09/26/2020 1456   VLDL 25.8 09/26/2020 1456   LDLCALC 64 09/26/2020 1456   LDLCALC 57 06/02/2017 1514    CBC    Component Value Date/Time   WBC 7.1 10/01/2022 1444   RBC 4.90 10/01/2022 1444   HGB 13.5 10/01/2022 1444   HGB 13.6 06/02/2017 1514   HCT 41.4 10/01/2022 1444   HCT 41.2 06/02/2017 1514   PLT 297  10/01/2022 1444   PLT 308 06/02/2017 1514   MCV 84.5 10/01/2022 1444   MCV 84 06/02/2017 1514   MCV 80 03/29/2014 0638   MCH 27.6 10/01/2022 1444   MCHC 32.6 10/01/2022 1444   RDW 12.8 10/01/2022 1444   RDW 13.3 06/02/2017 1514   RDW 15.6 (H) 03/29/2014 0638   LYMPHSABS 2.3 04/04/2017 1741   LYMPHSABS 0.6 (L) 03/29/2014 0638   MONOABS 0.7 04/04/2017 1741   MONOABS 0.7 03/29/2014 0638   EOSABS 0.1 04/04/2017 1741   EOSABS 0.1 03/29/2014 0638   BASOSABS 0.1  04/04/2017 1741   BASOSABS 0.0 03/29/2014 0638    Hgb A1C No results found for: "HGBA1C"          Assessment & Plan:   Preventative health maintenance:  Encouraged her to get a flu shot in the fall Tetanus declined today Encouraged her to get her COVID booster She no longer needs Pap smears Encouraged her to consume a balanced diet and exercise regimen Advised her to see a dentist annually We will check CBC, c-Met, lipid, A1c and hep C today  RTC in 6 months, follow-up chronic conditions Nicki Reaper, NP

## 2023-06-09 NOTE — Addendum Note (Signed)
Addended by: Kavin Leech E on: 06/09/2023 09:15 AM   Modules accepted: Orders

## 2023-06-09 NOTE — Assessment & Plan Note (Signed)
Encourage diet and exercise for weight loss 

## 2023-06-09 NOTE — Patient Instructions (Signed)

## 2023-06-10 LAB — LIPID PANEL
Cholesterol: 128 mg/dL (ref <200)
HDL: 43 mg/dL — ABNORMAL LOW (ref >=50)
LDL Cholesterol (Calc): 68 mg/dL (calc)
Non-HDL Cholesterol (Calc): 85 mg/dL (calc) (ref <130)
Total CHOL/HDL Ratio: 3 (calc) (ref <5.0)
Triglycerides: 90 mg/dL (ref <150)

## 2023-06-10 LAB — COMPLETE METABOLIC PANEL WITH GFR
AG Ratio: 1.6 (calc) (ref 1.0–2.5)
ALT: 11 U/L (ref 6–29)
AST: 12 U/L (ref 10–30)
Albumin: 4.7 g/dL (ref 3.6–5.1)
Alkaline phosphatase (APISO): 100 U/L (ref 31–125)
BUN: 19 mg/dL (ref 7–25)
CO2: 28 mmol/L (ref 20–32)
Calcium: 9.8 mg/dL (ref 8.6–10.2)
Chloride: 105 mmol/L (ref 98–110)
Creat: 0.73 mg/dL (ref 0.50–0.97)
Globulin: 3 g/dL (calc) (ref 1.9–3.7)
Glucose, Bld: 82 mg/dL (ref 65–99)
Potassium: 4.2 mmol/L (ref 3.5–5.3)
Sodium: 142 mmol/L (ref 135–146)
Total Bilirubin: 0.3 mg/dL (ref 0.2–1.2)
Total Protein: 7.7 g/dL (ref 6.1–8.1)
eGFR: 111 mL/min/{1.73_m2} (ref >=60)

## 2023-06-10 LAB — CBC
HCT: 45.7 % — ABNORMAL HIGH (ref 35.0–45.0)
Hemoglobin: 14.8 g/dL (ref 11.7–15.5)
MCH: 27.6 pg (ref 27.0–33.0)
MCHC: 32.4 g/dL (ref 32.0–36.0)
MCV: 85.1 fL (ref 80.0–100.0)
MPV: 9.7 fL (ref 7.5–12.5)
Platelets: 283 10*3/uL (ref 140–400)
RBC: 5.37 10*6/uL — ABNORMAL HIGH (ref 3.80–5.10)
RDW: 13.1 % (ref 11.0–15.0)
WBC: 7 10*3/uL (ref 3.8–10.8)

## 2023-06-10 LAB — HEMOGLOBIN A1C
Hgb A1c MFr Bld: 5.5 % of total Hgb (ref <5.7)
Mean Plasma Glucose: 111 mg/dL
eAG (mmol/L): 6.2 mmol/L

## 2023-06-10 LAB — TSH: TSH: 2.08 mIU/L

## 2023-06-10 LAB — HEPATITIS C ANTIBODY: Hepatitis C Ab: NONREACTIVE

## 2023-07-29 ENCOUNTER — Ambulatory Visit: Payer: 59 | Admitting: Podiatry

## 2023-08-08 ENCOUNTER — Encounter: Payer: Self-pay | Admitting: Podiatry

## 2023-08-08 ENCOUNTER — Ambulatory Visit: Payer: 59 | Admitting: Podiatry

## 2023-08-08 DIAGNOSIS — L600 Ingrowing nail: Secondary | ICD-10-CM | POA: Diagnosis not present

## 2023-08-08 DIAGNOSIS — R52 Pain, unspecified: Secondary | ICD-10-CM

## 2023-08-08 NOTE — Progress Notes (Signed)
   Chief Complaint  Patient presents with   Nail Problem    "I think I have an ingrown toenail." N - ingrown toenail L - hallux right D - January 2024 O - gradually worse C - sharp pain A - touch T - none    Subjective: Patient presents today for new complaint of pain regarding evaluation of pain to the lateral border of the right great toe. Patient is concerned for possible ingrown nail.  It is very sensitive to touch.  Patient presents today for further treatment and evaluation.  Past Medical History:  Diagnosis Date   Anxiety    Lynch syndrome    Risk for ovarian, uterine, & colon cancer. Yearly colonscopies with Dr. Artis Flock at [facility]   Wears glasses     Past Surgical History:  Procedure Laterality Date   COLONOSCOPY WITH PROPOFOL  08/23/2022   hx of yearly colonoscopies due to Lynch syndrome, most recent colonoscopy as of 09/19/22 is 08/23/22   CYSTOSCOPY N/A 10/03/2022   Procedure: CYSTOSCOPY;  Surgeon: Lavina Hamman, MD;  Location: [facility];  Service: Gynecology;  Laterality: N/A;   ESOPHAGOGASTRODUODENOSCOPY     hx of multiple EGDs, 2007, 2019, 2022   TOTAL LAPAROSCOPIC HYSTERECTOMY WITH BILATERAL SALPINGO OOPHORECTOMY Bilateral 10/03/2022   Procedure: TOTAL LAPAROSCOPIC HYSTERECTOMY WITH BILATERAL SALPINGO OOPHORECTOMY;  Surgeon: Lavina Hamman, MD;  Location: [facility];  Service: Gynecology;  Laterality: Bilateral;   WISDOM TOOTH EXTRACTION  08/09/2012    Allergies  Allergen Reactions   Latex Hives   Hydrocodone Hives    Objective:  General: Well developed, nourished, in no acute distress, alert and oriented x3   Dermatology: Skin is warm, dry and supple bilateral.  Lateral border of the right great toe is tender with evidence of an ingrowing nail. Pain on palpation noted to the border of the nail fold. The remaining nails appear unremarkable at this time.   Vascular: DP and PT pulses palpable.  No clinical evidence of  vascular compromise  Neruologic: Grossly intact via light touch bilateral.  Musculoskeletal: No pedal deformity noted  Assesement: #1 Paronychia with ingrowing nail lateral border right great toe #2 history of partial nail matricectomy RT med, LT med + lat  Plan of Care:  -Patient evaluated.  -Discussed treatment alternatives and plan of care. Explained nail avulsion procedure and post procedure course to patient. -Patient opted for permanent partial nail avulsion of the ingrown portion of the nail.  -Prior to procedure, local anesthesia infiltration utilized using 3 ml of a 50:50 mixture of 2% plain lidocaine and 0.5% plain marcaine in a normal hallux block fashion and a betadine prep performed.  -Partial permanent nail avulsion with chemical matrixectomy performed using 3x30sec applications of phenol followed by alcohol flush.  -Light dressing applied.  Post care instructions provided -Return to clinic 3 weeks  Felecia Shelling, DPM [facility]  Dr. Felecia Shelling, DPM    [address] [place], [place] [address]                Office [phone]  Fax [phone]

## 2023-11-26 LAB — HM COLONOSCOPY

## 2024-04-08 ENCOUNTER — Encounter: Payer: Self-pay | Admitting: Internal Medicine

## 2024-04-13 ENCOUNTER — Ambulatory Visit: Admitting: Internal Medicine

## 2024-04-13 NOTE — Progress Notes (Deleted)
 Subjective:    Patient ID: Stephanie Brandt, female    DOB: [birth year], 33 y.o.   MRN: [id number]  HPI   Review of Systems     Past Medical History:  Diagnosis Date   Anxiety    Lynch syndrome    Risk for ovarian, uterine, & colon cancer. Yearly colonscopies with Dr. Francesco Inks at [facility]   Wears glasses     Current Outpatient Medications  Medication Sig Dispense Refill   estradiol (CLIMARA - DOSED IN MG/24 HR) 0.1 mg/24hr patch Place 0.1 mg onto the skin once a week.     No current facility-administered medications for this visit.    Allergies  Allergen Reactions   Latex Hives   Hydrocodone Hives    Family History  Problem Relation Age of Onset   Hypertension Mother    Hypothyroidism Mother    Cancer Father     Social History   Socioeconomic History   Marital status: Single    Spouse name: Not on file   Number of children: Not on file   Years of education: Not on file   Highest education level: Not on file  Occupational History   Not on file  Tobacco Use   Smoking status: Former    Current packs/day: 0.00    Types: Cigarettes    Start date: 2012    Quit date: 2016    Years since quitting: 9.3   Smokeless tobacco: Never   Tobacco comments:    quit 2018  Vaping Use   Vaping status: Never Used  Substance and Sexual Activity   Alcohol use: Not Currently    Comment: seldom   Drug use: Never   Sexual activity: Yes    Birth control/protection: None  Other Topics Concern   Not on file  Social History Narrative   Not on file   Social Drivers of Health   Financial Resource Strain: Not on file  Food Insecurity: Not on file  Transportation Needs: Not on file  Physical Activity: Not on file  Stress: Not on file  Social Connections: Not on file  Intimate Partner Violence: Not on file     Constitutional: Denies fever, malaise, fatigue, headache or abrupt weight changes.  HEENT: Denies eye pain, eye redness, ear pain, ringing in the ears, wax buildup,  runny nose, nasal congestion, bloody nose, or sore throat. Respiratory: Denies difficulty breathing, shortness of breath, cough or sputum production.   Cardiovascular: Denies chest pain, chest tightness, palpitations or swelling in the hands or feet.  Gastrointestinal: Denies abdominal pain, bloating, constipation, diarrhea or blood in the stool.  GU: Denies urgency, frequency, pain with urination, burning sensation, blood in urine, odor or discharge. Musculoskeletal: Denies decrease in range of motion, difficulty with gait, muscle pain or joint pain and swelling.  Skin: Patient reports black toenail.  Denies redness, rashes, lesions or ulcercations.  Neurological: Denies dizziness, difficulty with memory, difficulty with speech or problems with balance and coordination.  Psych: Patient has a history of anxiety.  Denies depression, SI/HI.  No other specific complaints in a complete review of systems (except as listed in HPI above).  Objective:   Physical Exam  LMP 09/24/2022 (Exact Date) Comment: DOS UPREG NEGATIVE  Wt Readings from Last 3 Encounters:  06/09/23 164 lb (74.4 kg)  10/03/22 178 lb 3.2 oz (80.8 kg)  10/01/22 175 lb 6.4 oz (79.6 kg)    General: Appears her stated age, overweight in NAD. Skin: Warm, dry and intact.  HEENT:  Head: normal shape and size; Eyes: sclera white, no icterus, conjunctiva pink, PERRLA and EOMs intact;  Neck:  Neck supple, trachea midline. No masses, lumps or thyromegaly present.  Cardiovascular: Normal rate and rhythm. S1,S2 noted.  No murmur, rubs or gallops noted. No JVD or BLE edema.  Pulmonary/Chest: Normal effort and positive vesicular breath sounds. No respiratory distress. No wheezes, rales or ronchi noted.  Abdomen: Normal bowel sounds.  Musculoskeletal: Strength 5/5 BUE/BLE. No difficulty with gait.  Neurological: Alert and oriented. Cranial nerves II-XII grossly intact. Coordination normal.  Psychiatric: Mood and affect normal. Behavior is  normal. Judgment and thought content normal.     BMET    Component Value Date/Time   NA 142 06/09/2023 0919   NA 138 06/02/2017 1514   NA 138 03/29/2014 0638   K 4.2 06/09/2023 0919   K 3.7 03/29/2014 0638   CL 105 06/09/2023 0919   CL 107 03/29/2014 0638   CO2 28 06/09/2023 0919   CO2 26 03/29/2014 0638   GLUCOSE 82 06/09/2023 0919   GLUCOSE 108 (H) 03/29/2014 0638   BUN 19 06/09/2023 0919   BUN 11 06/02/2017 1514   BUN 16 03/29/2014 0638   CREATININE 0.73 06/09/2023 0919   CALCIUM  9.8 06/09/2023 0919   CALCIUM  8.8 03/29/2014 0638   GFRNONAA 127 06/02/2017 1514   GFRNONAA >60 03/29/2014 0638   GFRAA 147 06/02/2017 1514   GFRAA >60 03/29/2014 0638    Lipid Panel     Component Value Date/Time   CHOL 128 06/09/2023 0919   CHOL 113 06/02/2017 1514   TRIG 90 06/09/2023 0919   HDL 43 (L) 06/09/2023 0919   HDL 32 (L) 06/02/2017 1514   CHOLHDL 3.0 06/09/2023 0919   VLDL 25.8 09/26/2020 1456   LDLCALC 68 06/09/2023 0919    CBC    Component Value Date/Time   WBC 7.0 06/09/2023 0919   RBC 5.37 (H) 06/09/2023 0919   HGB 14.8 06/09/2023 0919   HGB 13.6 06/02/2017 1514   HCT 45.7 (H) 06/09/2023 0919   HCT 41.2 06/02/2017 1514   PLT 283 06/09/2023 0919   PLT 308 06/02/2017 1514   MCV 85.1 06/09/2023 0919   MCV 84 06/02/2017 1514   MCV 80 03/29/2014 0638   MCH 27.6 06/09/2023 0919   MCHC 32.4 06/09/2023 0919   RDW 13.1 06/09/2023 0919   RDW 13.3 06/02/2017 1514   RDW 15.6 (H) 03/29/2014 0638   LYMPHSABS 2.3 04/04/2017 1741   LYMPHSABS 0.6 (L) 03/29/2014 0638   MONOABS 0.7 04/04/2017 1741   MONOABS 0.7 03/29/2014 0638   EOSABS 0.1 04/04/2017 1741   EOSABS 0.1 03/29/2014 0638   BASOSABS 0.1 04/04/2017 1741   BASOSABS 0.0 03/29/2014 0638    Hgb A1C Lab Results  Component Value Date   HGBA1C 5.5 06/09/2023            Assessment & Plan:     RTC in 2 months for your annual exam Helayne Lo, NP

## 2024-04-29 ENCOUNTER — Ambulatory Visit: Admitting: Internal Medicine

## 2024-04-29 NOTE — Progress Notes (Deleted)
 Subjective:    Patient ID: Stephanie Brandt, female    DOB: [birth year], 33 y.o.   MRN: [id number]  HPI   Review of Systems     Past Medical History:  Diagnosis Date   Anxiety    Lynch syndrome    Risk for ovarian, uterine, & colon cancer. Yearly colonscopies with Dr. Francesco Inks at [facility]   Wears glasses     Current Outpatient Medications  Medication Sig Dispense Refill   estradiol (CLIMARA - DOSED IN MG/24 HR) 0.1 mg/24hr patch Place 0.1 mg onto the skin once a week.     No current facility-administered medications for this visit.    Allergies  Allergen Reactions   Latex Hives   Hydrocodone Hives    Family History  Problem Relation Age of Onset   Hypertension Mother    Hypothyroidism Mother    Cancer Father     Social History   Socioeconomic History   Marital status: Single    Spouse name: Not on file   Number of children: Not on file   Years of education: Not on file   Highest education level: Not on file  Occupational History   Not on file  Tobacco Use   Smoking status: Former    Current packs/day: 0.00    Types: Cigarettes    Start date: 2012    Quit date: 2016    Years since quitting: 9.3   Smokeless tobacco: Never   Tobacco comments:    quit 2018  Vaping Use   Vaping status: Never Used  Substance and Sexual Activity   Alcohol use: Not Currently    Comment: seldom   Drug use: Never   Sexual activity: Yes    Birth control/protection: None  Other Topics Concern   Not on file  Social History Narrative   Not on file   Social Drivers of Health   Financial Resource Strain: Not on file  Food Insecurity: Not on file  Transportation Needs: Not on file  Physical Activity: Not on file  Stress: Not on file  Social Connections: Not on file  Intimate Partner Violence: Not on file     Constitutional: Denies fever, malaise, fatigue, headache or abrupt weight changes.  HEENT: Denies eye pain, eye redness, ear pain, ringing in the ears, wax buildup,  runny nose, nasal congestion, bloody nose, or sore throat. Respiratory: Denies difficulty breathing, shortness of breath, cough or sputum production.   Cardiovascular: Denies chest pain, chest tightness, palpitations or swelling in the hands or feet.  Gastrointestinal: Denies abdominal pain, bloating, constipation, diarrhea or blood in the stool.  GU: Denies urgency, frequency, pain with urination, burning sensation, blood in urine, odor or discharge. Musculoskeletal: Denies decrease in range of motion, difficulty with gait, muscle pain or joint pain and swelling.  Skin: Patient reports black toenail.  Denies redness, rashes, lesions or ulcercations.  Neurological: Denies dizziness, difficulty with memory, difficulty with speech or problems with balance and coordination.  Psych: Patient has a history of anxiety.  Denies depression, SI/HI.  No other specific complaints in a complete review of systems (except as listed in HPI above).  Objective:   Physical Exam  LMP 09/24/2022 (Exact Date) Comment: DOS UPREG NEGATIVE  Wt Readings from Last 3 Encounters:  06/09/23 164 lb (74.4 kg)  10/03/22 178 lb 3.2 oz (80.8 kg)  10/01/22 175 lb 6.4 oz (79.6 kg)    General: Appears her stated age, overweight in NAD. Skin: Warm, dry and intact.  HEENT:  Head: normal shape and size; Eyes: sclera white, no icterus, conjunctiva pink, PERRLA and EOMs intact;  Neck:  Neck supple, trachea midline. No masses, lumps or thyromegaly present.  Cardiovascular: Normal rate and rhythm. S1,S2 noted.  No murmur, rubs or gallops noted. No JVD or BLE edema.  Pulmonary/Chest: Normal effort and positive vesicular breath sounds. No respiratory distress. No wheezes, rales or ronchi noted.  Abdomen: Normal bowel sounds.  Musculoskeletal: Strength 5/5 BUE/BLE. No difficulty with gait.  Neurological: Alert and oriented. Cranial nerves II-XII grossly intact. Coordination normal.  Psychiatric: Mood and affect normal. Behavior is  normal. Judgment and thought content normal.     BMET    Component Value Date/Time   NA 142 06/09/2023 0919   NA 138 06/02/2017 1514   NA 138 03/29/2014 0638   K 4.2 06/09/2023 0919   K 3.7 03/29/2014 0638   CL 105 06/09/2023 0919   CL 107 03/29/2014 0638   CO2 28 06/09/2023 0919   CO2 26 03/29/2014 0638   GLUCOSE 82 06/09/2023 0919   GLUCOSE 108 (H) 03/29/2014 0638   BUN 19 06/09/2023 0919   BUN 11 06/02/2017 1514   BUN 16 03/29/2014 0638   CREATININE 0.73 06/09/2023 0919   CALCIUM  9.8 06/09/2023 0919   CALCIUM  8.8 03/29/2014 0638   GFRNONAA 127 06/02/2017 1514   GFRNONAA >60 03/29/2014 0638   GFRAA 147 06/02/2017 1514   GFRAA >60 03/29/2014 0638    Lipid Panel     Component Value Date/Time   CHOL 128 06/09/2023 0919   CHOL 113 06/02/2017 1514   TRIG 90 06/09/2023 0919   HDL 43 (L) 06/09/2023 0919   HDL 32 (L) 06/02/2017 1514   CHOLHDL 3.0 06/09/2023 0919   VLDL 25.8 09/26/2020 1456   LDLCALC 68 06/09/2023 0919    CBC    Component Value Date/Time   WBC 7.0 06/09/2023 0919   RBC 5.37 (H) 06/09/2023 0919   HGB 14.8 06/09/2023 0919   HGB 13.6 06/02/2017 1514   HCT 45.7 (H) 06/09/2023 0919   HCT 41.2 06/02/2017 1514   PLT 283 06/09/2023 0919   PLT 308 06/02/2017 1514   MCV 85.1 06/09/2023 0919   MCV 84 06/02/2017 1514   MCV 80 03/29/2014 0638   MCH 27.6 06/09/2023 0919   MCHC 32.4 06/09/2023 0919   RDW 13.1 06/09/2023 0919   RDW 13.3 06/02/2017 1514   RDW 15.6 (H) 03/29/2014 0638   LYMPHSABS 2.3 04/04/2017 1741   LYMPHSABS 0.6 (L) 03/29/2014 0638   MONOABS 0.7 04/04/2017 1741   MONOABS 0.7 03/29/2014 0638   EOSABS 0.1 04/04/2017 1741   EOSABS 0.1 03/29/2014 0638   BASOSABS 0.1 04/04/2017 1741   BASOSABS 0.0 03/29/2014 0638    Hgb A1C Lab Results  Component Value Date   HGBA1C 5.5 06/09/2023            Assessment & Plan:     RTC in 2 months for your annual exam Helayne Lo, NP

## 2024-06-09 ENCOUNTER — Encounter: Payer: Self-pay | Admitting: Internal Medicine

## 2024-06-25 ENCOUNTER — Encounter: Payer: Self-pay | Admitting: Internal Medicine

## 2024-06-25 ENCOUNTER — Ambulatory Visit (INDEPENDENT_AMBULATORY_CARE_PROVIDER_SITE_OTHER): Admitting: Internal Medicine

## 2024-06-25 VITALS — BP 104/68 | Ht 67.0 in | Wt 155.8 lb

## 2024-06-25 DIAGNOSIS — Z0001 Encounter for general adult medical examination with abnormal findings: Secondary | ICD-10-CM

## 2024-06-25 DIAGNOSIS — Z23 Encounter for immunization: Secondary | ICD-10-CM | POA: Diagnosis not present

## 2024-06-25 DIAGNOSIS — Z136 Encounter for screening for cardiovascular disorders: Secondary | ICD-10-CM | POA: Diagnosis not present

## 2024-06-25 DIAGNOSIS — R739 Hyperglycemia, unspecified: Secondary | ICD-10-CM | POA: Diagnosis not present

## 2024-06-25 DIAGNOSIS — R21 Rash and other nonspecific skin eruption: Secondary | ICD-10-CM

## 2024-06-25 NOTE — Patient Instructions (Signed)
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
## 2024-06-26 ENCOUNTER — Ambulatory Visit: Payer: Self-pay | Admitting: Internal Medicine

## 2024-06-26 LAB — LIPID PANEL
Cholesterol: 132 mg/dL (ref <200)
HDL: 46 mg/dL — ABNORMAL LOW (ref >=50)
LDL Cholesterol (Calc): 73 mg/dL
Non-HDL Cholesterol (Calc): 86 mg/dL (ref <130)
Total CHOL/HDL Ratio: 2.9 (calc) (ref <5.0)
Triglycerides: 52 mg/dL (ref <150)

## 2024-06-26 LAB — COMPREHENSIVE METABOLIC PANEL WITH GFR
AG Ratio: 1.6 (calc) (ref 1.0–2.5)
ALT: 14 U/L (ref 6–29)
AST: 14 U/L (ref 10–30)
Albumin: 4.2 g/dL (ref 3.6–5.1)
Alkaline phosphatase (APISO): 65 U/L (ref 31–125)
BUN: 12 mg/dL (ref 7–25)
CO2: 30 mmol/L (ref 20–32)
Calcium: 9.4 mg/dL (ref 8.6–10.2)
Chloride: 104 mmol/L (ref 98–110)
Creat: 0.68 mg/dL (ref 0.50–0.97)
Globulin: 2.6 g/dL (ref 1.9–3.7)
Glucose, Bld: 89 mg/dL (ref 65–99)
Potassium: 4.6 mmol/L (ref 3.5–5.3)
Sodium: 138 mmol/L (ref 135–146)
Total Bilirubin: 0.3 mg/dL (ref 0.2–1.2)
Total Protein: 6.8 g/dL (ref 6.1–8.1)
eGFR: 117 mL/min/1.73m2 (ref >=60)

## 2024-06-26 LAB — HEMOGLOBIN A1C
Hgb A1c MFr Bld: 5.4 % (ref <5.7)
Mean Plasma Glucose: 108 mg/dL
eAG (mmol/L): 6 mmol/L

## 2024-06-26 LAB — CBC
HCT: 40 % (ref 35.0–45.0)
Hemoglobin: 12.7 g/dL (ref 11.7–15.5)
MCH: 27.2 pg (ref 27.0–33.0)
MCHC: 31.8 g/dL — ABNORMAL LOW (ref 32.0–36.0)
MCV: 85.7 fL (ref 80.0–100.0)
MPV: 10.4 fL (ref 7.5–12.5)
Platelets: 233 Thousand/uL (ref 140–400)
RBC: 4.67 Million/uL (ref 3.80–5.10)
RDW: 13.3 % (ref 11.0–15.0)
WBC: 6.1 Thousand/uL (ref 3.8–10.8)

## 2024-07-20 ENCOUNTER — Encounter: Payer: Self-pay | Admitting: Podiatry

## 2024-07-20 ENCOUNTER — Ambulatory Visit (INDEPENDENT_AMBULATORY_CARE_PROVIDER_SITE_OTHER)

## 2024-07-20 ENCOUNTER — Ambulatory Visit: Admitting: Podiatry

## 2024-07-20 DIAGNOSIS — M778 Other enthesopathies, not elsewhere classified: Secondary | ICD-10-CM

## 2024-07-20 DIAGNOSIS — M84375D Stress fracture, left foot, subsequent encounter for fracture with routine healing: Secondary | ICD-10-CM

## 2024-07-21 ENCOUNTER — Encounter: Payer: Self-pay | Admitting: Podiatry

## 2024-07-21 ENCOUNTER — Ambulatory Visit
Admission: RE | Admit: 2024-07-21 | Discharge: 2024-07-21 | Disposition: A | Discharge status: Home / Self Care | Source: Ambulatory Visit | Attending: Podiatry | Admitting: Podiatry

## 2024-07-21 ENCOUNTER — Ambulatory Visit: Payer: Self-pay | Admitting: Podiatry

## 2024-07-21 DIAGNOSIS — M84375D Stress fracture, left foot, subsequent encounter for fracture with routine healing: Secondary | ICD-10-CM

## 2024-07-21 DIAGNOSIS — M778 Other enthesopathies, not elsewhere classified: Secondary | ICD-10-CM

## 2024-07-22 NOTE — Telephone Encounter (Signed)
 Orie- is this something you can help with?

## 2024-07-23 ENCOUNTER — Ambulatory Visit: Admitting: Podiatry

## 2024-07-23 NOTE — Progress Notes (Signed)
 Subjective:  Patient ID: Stephanie Brandt, female    DOB: 09/15/1991,  MRN: [id number]  Chief Complaint  Patient presents with   Foot Pain    Left foot dorsal pain.. had pitted edema in ankle 2 weeks ago. . Did an ice bath for foot. 0 pain today. Pt. Is a runner and last ran Saturday.    Discussed the use of AI scribe software for clinical note transcription with the patient, who gave verbal consent to proceed.  History of Present Illness Stephanie Brandt is a 33 year old female who presents with foot pain and swelling while training for a marathon.  She experiences pain on the dorsum of her foot, initially thought to be due to tight shoelaces, which reappeared after a three-mile run and is now persistent, especially during dorsiflexion. The pain is described as a pulling sensation, less severe today than yesterday. Ankle swelling occurred after long runs, initially presenting as pitting edema that resolved with rest. Swelling recurred after a short run without fluid retention. Intermittent lateral ankle pain, radiating through to the other side, has since stopped. She has been icing the affected area. Walking does not cause pain, but discomfort occurs when pointing her toes upward. She rotates between three pairs of [employer] running shoes and has not changed her footwear recently. She is currently on an estrogen patch following a full hysterectomy two years ago.      Objective:    Physical Exam General: AAO x3, NAD  Dermatological: Skin is warm, dry and supple bilateral.  There are no open sores, no preulcerative lesions, no rash or signs of infection present.  Vascular: Dorsalis Pedis artery and Posterior Tibial artery pedal pulses are 2/4 bilateral with immedate capillary fill time. There is no pain with calf compression, swelling, warmth, erythema.   Neruologic: Grossly intact via light touch bilateral.   Musculoskeletal: I am not able to palpate any area of pinpoint  tenderness.  There is no pain with vibratory sensation.  Flexor, extensor tendons are intact.  There is trace edema present.  No erythema.  MMT 5/5.  She does get some subjective discomfort in the course of the flexor tendons of the ankle.  Ankle, subtalar joint range of motion intact.  Gait: Unassisted, Nonantalgic.     No images are attached to the encounter.    Results RADIOLOGY Foot X-ray: No fracture, signs of inflammation observed (07/20/2024)   Assessment:   1. Stress fracture of left foot with routine healing, subsequent encounter   2. Extensor tendonitis of foot      Plan:  Patient was evaluated and treated and all questions answered.  Assessment and Plan Assessment & Plan Left foot and ankle tendinitis Suspected tendinitis due to pain location and nature, with absence of pain during normal walking suggesting no stress fracture. - Refer to physical therapy for ankle and foot strengthening and home exercise program. - Advise to avoid long runs and limit running until MRI results are obtained. - Recommend continuing with good arch support in shoes.  Possible left foot stress fracture Stress fracture considered due to increased running and localized dorsum pain, but less likely due to absence of constant pain and lack of visible fracture lines on x-ray. MRI suggested for further evaluation. - Order MRI of the left foot to check for stress fractures and assess tendons. - Advise to avoid running until MRI results are available. - Instruct to monitor for increased pain during normal ambulation, which may necessitate the  use of a boot.   Return for MRI results.   Donnice JONELLE Fees DPM

## 2025-06-27 ENCOUNTER — Encounter: Admitting: Internal Medicine
